# Patient Record
Sex: Female | Born: 1975 | Marital: Married
Health system: Southern US, Community
[De-identification: ages and names within clinical notes are randomized; demographics above are authoritative.]

---

## 2009-11-08 ENCOUNTER — Emergency Department: Payer: Self-pay | Admitting: Emergency Medicine

## 2010-01-02 ENCOUNTER — Emergency Department: Payer: Self-pay | Admitting: Emergency Medicine

## 2010-03-27 ENCOUNTER — Inpatient Hospital Stay: Payer: Self-pay | Admitting: Internal Medicine

## 2010-07-14 ENCOUNTER — Emergency Department: Payer: Self-pay | Admitting: Internal Medicine

## 2010-09-14 ENCOUNTER — Emergency Department: Payer: Self-pay | Admitting: Emergency Medicine

## 2010-09-15 ENCOUNTER — Emergency Department: Payer: Self-pay | Admitting: Unknown Physician Specialty

## 2010-10-18 ENCOUNTER — Emergency Department: Payer: Self-pay | Admitting: Emergency Medicine

## 2011-03-15 ENCOUNTER — Inpatient Hospital Stay: Payer: Self-pay | Admitting: *Deleted

## 2011-04-05 ENCOUNTER — Emergency Department: Payer: Self-pay | Admitting: Emergency Medicine

## 2011-06-21 ENCOUNTER — Emergency Department: Payer: Self-pay | Admitting: Unknown Physician Specialty

## 2011-07-05 ENCOUNTER — Other Ambulatory Visit: Payer: Self-pay | Admitting: Family Medicine

## 2011-11-30 ENCOUNTER — Inpatient Hospital Stay: Payer: Self-pay | Admitting: Internal Medicine

## 2011-11-30 LAB — COMPREHENSIVE METABOLIC PANEL
Albumin: 3.1 g/dL — ABNORMAL LOW (ref 3.4–5.0)
Alkaline Phosphatase: 109 U/L (ref 50–136)
BUN: 36 mg/dL — ABNORMAL HIGH (ref 7–18)
Calcium, Total: 9.7 mg/dL (ref 8.5–10.1)
Chloride: >128 mmol/L — ABNORMAL HIGH (ref 98–107)
Creatinine: 1.19 mg/dL (ref 0.60–1.30)
EGFR (African American): >60
Glucose: 226 mg/dL — ABNORMAL HIGH (ref 65–99)
Potassium: 3 mmol/L — ABNORMAL LOW (ref 3.5–5.1)
SGOT(AST): 55 U/L — ABNORMAL HIGH (ref 15–37)
SGPT (ALT): 60 U/L

## 2011-11-30 LAB — CBC
HGB: 15.4 g/dL (ref 13.0–18.0)
MCH: 31.4 pg (ref 26.0–34.0)
MCHC: 31.7 g/dL — ABNORMAL LOW (ref 32.0–36.0)
MCV: 99 fL (ref 80–100)
Platelet: 173 10*3/uL (ref 150–440)
RBC: 4.89 10*6/uL (ref 4.40–5.90)
RDW: 13.9 % (ref 11.5–14.5)

## 2011-11-30 LAB — URINALYSIS, COMPLETE
Ketone: NEGATIVE
Leukocyte Esterase: NEGATIVE
Nitrite: NEGATIVE
Ph: 5 (ref 4.5–8.0)
Protein: 100
RBC,UR: 11 /HPF (ref 0–5)
Specific Gravity: 1.034 (ref 1.003–1.030)
Squamous Epithelial: NONE SEEN

## 2011-12-01 LAB — BASIC METABOLIC PANEL
Calcium, Total: 7.9 mg/dL — ABNORMAL LOW (ref 8.5–10.1)
Calcium, Total: 8 mg/dL — ABNORMAL LOW (ref 8.5–10.1)
Co2: 28 mmol/L (ref 21–32)
Creatinine: 0.82 mg/dL (ref 0.60–1.30)
Creatinine: 1.01 mg/dL (ref 0.60–1.30)
EGFR (African American): >60
EGFR (African American): >60
EGFR (Non-African Amer.): >60
Glucose: 246 mg/dL — ABNORMAL HIGH (ref 65–99)
Potassium: 3.1 mmol/L — ABNORMAL LOW (ref 3.5–5.1)
Sodium: >160 mmol/L (ref 136–145)
Sodium: >160 mmol/L (ref 136–145)

## 2011-12-01 LAB — CBC WITH DIFFERENTIAL/PLATELET
Basophil #: 0 10*3/uL (ref 0.0–0.1)
Eosinophil #: 0.1 10*3/uL (ref 0.0–0.7)
HCT: 39.5 % — ABNORMAL LOW (ref 40.0–52.0)
Lymphocyte #: 2.5 10*3/uL (ref 1.0–3.6)
Lymphocyte %: 18 %
MCHC: 31.9 g/dL — ABNORMAL LOW (ref 32.0–36.0)
MCV: 99 fL (ref 80–100)
Monocyte %: 3.6 %
Neutrophil #: 10.6 10*3/uL — ABNORMAL HIGH (ref 1.4–6.5)
Neutrophil %: 77.3 %
Platelet: 141 10*3/uL — ABNORMAL LOW (ref 150–440)
RDW: 14.3 % (ref 11.5–14.5)
WBC: 13.8 10*3/uL — ABNORMAL HIGH (ref 3.8–10.6)

## 2011-12-01 LAB — SODIUM: Sodium: >160 mmol/L (ref 136–145)

## 2011-12-02 LAB — CBC WITH DIFFERENTIAL/PLATELET
Basophil %: 0.2 %
Eosinophil #: 0.3 10*3/uL (ref 0.0–0.7)
Eosinophil %: 2.7 %
HCT: 32.8 % — ABNORMAL LOW (ref 40.0–52.0)
HGB: 10.5 g/dL — ABNORMAL LOW (ref 13.0–18.0)
Lymphocyte #: 1.5 10*3/uL (ref 1.0–3.6)
Lymphocyte %: 15.7 %
Monocyte %: 4.5 %
Neutrophil #: 7.4 10*3/uL — ABNORMAL HIGH (ref 1.4–6.5)
Neutrophil %: 76.9 %
RBC: 3.35 10*6/uL — ABNORMAL LOW (ref 4.40–5.90)
RDW: 13.3 % (ref 11.5–14.5)
WBC: 9.6 10*3/uL (ref 3.8–10.6)

## 2011-12-02 LAB — URINE CULTURE

## 2011-12-02 LAB — BASIC METABOLIC PANEL
Anion Gap: 8 (ref 7–16)
BUN: 12 mg/dL (ref 7–18)
Chloride: 124 mmol/L — ABNORMAL HIGH (ref 98–107)
Creatinine: 1.03 mg/dL (ref 0.60–1.30)
EGFR (Non-African Amer.): >60
Glucose: 100 mg/dL — ABNORMAL HIGH (ref 65–99)
Osmolality: 311 (ref 275–301)
Potassium: 3.2 mmol/L — ABNORMAL LOW (ref 3.5–5.1)

## 2011-12-03 LAB — BASIC METABOLIC PANEL
Anion Gap: 5 — ABNORMAL LOW (ref 7–16)
BUN: 6 mg/dL — ABNORMAL LOW (ref 7–18)
Chloride: 122 mmol/L — ABNORMAL HIGH (ref 98–107)
Co2: 25 mmol/L (ref 21–32)
Creatinine: 0.98 mg/dL (ref 0.60–1.30)
EGFR (African American): >60
EGFR (Non-African Amer.): >60
Glucose: 115 mg/dL — ABNORMAL HIGH (ref 65–99)
Osmolality: 300 (ref 275–301)
Potassium: 3.7 mmol/L (ref 3.5–5.1)
Sodium: 152 mmol/L — ABNORMAL HIGH (ref 136–145)

## 2011-12-04 LAB — BASIC METABOLIC PANEL
Anion Gap: 11 (ref 7–16)
BUN: 5 mg/dL — ABNORMAL LOW (ref 7–18)
Chloride: 119 mmol/L — ABNORMAL HIGH (ref 98–107)
EGFR (Non-African Amer.): >60
Glucose: 126 mg/dL — ABNORMAL HIGH (ref 65–99)
Osmolality: 304 (ref 275–301)
Potassium: 3.8 mmol/L (ref 3.5–5.1)

## 2011-12-05 LAB — BASIC METABOLIC PANEL
Anion Gap: 9 (ref 7–16)
BUN: 4 mg/dL — ABNORMAL LOW (ref 7–18)
Co2: 25 mmol/L (ref 21–32)
Creatinine: 0.67 mg/dL (ref 0.60–1.30)
EGFR (African American): >60
Potassium: 4.3 mmol/L (ref 3.5–5.1)

## 2011-12-06 LAB — BASIC METABOLIC PANEL
Anion Gap: 11 (ref 7–16)
BUN: 7 mg/dL (ref 7–18)
Calcium, Total: 9.3 mg/dL (ref 8.5–10.1)
Chloride: 110 mmol/L — ABNORMAL HIGH (ref 98–107)
Co2: 28 mmol/L (ref 21–32)
Creatinine: 0.72 mg/dL (ref 0.60–1.30)
EGFR (African American): >60
EGFR (Non-African Amer.): >60
Glucose: 107 mg/dL — ABNORMAL HIGH (ref 65–99)
Osmolality: 295 (ref 275–301)
Potassium: 4.1 mmol/L (ref 3.5–5.1)
Sodium: 149 mmol/L — ABNORMAL HIGH (ref 136–145)

## 2011-12-06 LAB — CULTURE, BLOOD (SINGLE)

## 2011-12-06 LAB — PLATELET COUNT: Platelet: 282 10*3/uL (ref 150–440)

## 2011-12-07 LAB — BASIC METABOLIC PANEL
BUN: 9 mg/dL (ref 7–18)
Calcium, Total: 8.9 mg/dL (ref 8.5–10.1)
EGFR (African American): >60
EGFR (Non-African Amer.): >60
Glucose: 109 mg/dL — ABNORMAL HIGH (ref 65–99)
Osmolality: 290 (ref 275–301)
Potassium: 3.5 mmol/L (ref 3.5–5.1)
Sodium: 146 mmol/L — ABNORMAL HIGH (ref 136–145)

## 2011-12-08 LAB — SODIUM: Sodium: 147 mmol/L — ABNORMAL HIGH (ref 136–145)

## 2011-12-09 LAB — SODIUM: Sodium: 145 mmol/L (ref 136–145)

## 2011-12-28 ENCOUNTER — Inpatient Hospital Stay: Payer: Self-pay | Admitting: Internal Medicine

## 2011-12-28 LAB — URINALYSIS, COMPLETE
Bilirubin,UR: NEGATIVE
Blood: NEGATIVE
Glucose,UR: NEGATIVE mg/dL (ref 0–75)
Ketone: NEGATIVE
Leukocyte Esterase: NEGATIVE
Ph: 8 (ref 4.5–8.0)
RBC,UR: 1 /HPF (ref 0–5)
Squamous Epithelial: NONE SEEN
WBC UR: 2 /HPF (ref 0–5)

## 2011-12-28 LAB — CBC
HCT: 40.9 % (ref 40.0–52.0)
HGB: 13.4 g/dL (ref 13.0–18.0)
MCHC: 32.8 g/dL (ref 32.0–36.0)
Platelet: 397 10*3/uL (ref 150–440)
RBC: 4.24 10*6/uL — ABNORMAL LOW (ref 4.40–5.90)
WBC: 6.2 10*3/uL (ref 3.8–10.6)

## 2011-12-28 LAB — COMPREHENSIVE METABOLIC PANEL
Anion Gap: 9 (ref 7–16)
BUN: 13 mg/dL (ref 7–18)
Bilirubin,Total: 0.3 mg/dL (ref 0.2–1.0)
Calcium, Total: 9 mg/dL (ref 8.5–10.1)
Chloride: 104 mmol/L (ref 98–107)
Co2: 24 mmol/L (ref 21–32)
EGFR (African American): >60
SGOT(AST): 31 U/L (ref 15–37)
Total Protein: 8.6 g/dL — ABNORMAL HIGH (ref 6.4–8.2)

## 2011-12-28 LAB — TROPONIN I: Troponin-I: <0.02 ng/mL

## 2011-12-29 LAB — CBC WITH DIFFERENTIAL/PLATELET
Basophil #: 0 10*3/uL (ref 0.0–0.1)
Basophil %: 0.2 %
Eosinophil #: 0 10*3/uL (ref 0.0–0.7)
Eosinophil %: 0.5 %
HCT: 41.4 % (ref 40.0–52.0)
HGB: 13.5 g/dL (ref 13.0–18.0)
Lymphocyte #: 2.2 10*3/uL (ref 1.0–3.6)
Lymphocyte %: 22.8 %
MCH: 31.7 pg (ref 26.0–34.0)
MCHC: 32.5 g/dL (ref 32.0–36.0)
Monocyte #: 0.1 10*3/uL (ref 0.0–0.7)
Neutrophil #: 7.1 10*3/uL — ABNORMAL HIGH (ref 1.4–6.5)
RDW: 14.2 % (ref 11.5–14.5)
WBC: 9.4 10*3/uL (ref 3.8–10.6)

## 2011-12-29 LAB — PROTIME-INR: INR: 1

## 2011-12-29 LAB — BASIC METABOLIC PANEL
Anion Gap: 12 (ref 7–16)
BUN: 10 mg/dL (ref 7–18)
Calcium, Total: 9.2 mg/dL (ref 8.5–10.1)
Co2: 25 mmol/L (ref 21–32)
EGFR (Non-African Amer.): >60
Glucose: 79 mg/dL (ref 65–99)
Osmolality: 281 (ref 275–301)
Potassium: 4.6 mmol/L (ref 3.5–5.1)

## 2011-12-29 LAB — APTT: Activated PTT: 36.3 secs — ABNORMAL HIGH (ref 23.6–35.9)

## 2011-12-30 LAB — PROTIME-INR
INR: 1
Prothrombin Time: 13.7 secs (ref 11.5–14.7)

## 2011-12-30 LAB — APTT: Activated PTT: 74.3 secs — ABNORMAL HIGH (ref 23.6–35.9)

## 2011-12-31 LAB — APTT
Activated PTT: 140.8 secs — ABNORMAL HIGH (ref 23.6–35.9)
Activated PTT: 93.8 secs — ABNORMAL HIGH (ref 23.6–35.9)

## 2011-12-31 LAB — PLATELET COUNT: Platelet: 389 10*3/uL (ref 150–440)

## 2011-12-31 LAB — HEMOGLOBIN: HGB: 11.5 g/dL — ABNORMAL LOW (ref 13.0–18.0)

## 2012-01-01 LAB — APTT: Activated PTT: 141.2 secs — ABNORMAL HIGH (ref 23.6–35.9)

## 2012-01-01 LAB — PROTIME-INR
INR: 2.3
Prothrombin Time: 25.4 secs — ABNORMAL HIGH (ref 11.5–14.7)

## 2012-01-01 LAB — WOUND CULTURE

## 2012-01-02 LAB — CULTURE, BLOOD (SINGLE)

## 2012-01-28 ENCOUNTER — Other Ambulatory Visit: Payer: Self-pay

## 2012-01-28 LAB — PROTIME-INR: INR: 3.7

## 2012-07-16 ENCOUNTER — Emergency Department: Payer: Self-pay | Admitting: Emergency Medicine

## 2012-07-16 LAB — CBC WITH DIFFERENTIAL/PLATELET
Comment - H1-Com1: NORMAL
Eosinophil: 2 %
HCT: 44.2 % (ref 40.0–52.0)
MCV: 97 fL (ref 80–100)
Platelet: 187 10*3/uL (ref 150–440)
RDW: 13.3 % (ref 11.5–14.5)
Variant Lymphocyte - H1-Rlymph: 6 %
WBC: 9.5 10*3/uL (ref 3.8–10.6)

## 2012-07-16 LAB — COMPREHENSIVE METABOLIC PANEL
Albumin: 3.5 g/dL (ref 3.4–5.0)
Alkaline Phosphatase: 107 U/L (ref 50–136)
Anion Gap: 8 (ref 7–16)
BUN: 23 mg/dL — ABNORMAL HIGH (ref 7–18)
Bilirubin,Total: 0.7 mg/dL (ref 0.2–1.0)
Co2: 27 mmol/L (ref 21–32)
Creatinine: 0.78 mg/dL (ref 0.60–1.30)
EGFR (African American): >60
Osmolality: 309 (ref 275–301)
SGOT(AST): 31 U/L (ref 15–37)
Sodium: 154 mmol/L — ABNORMAL HIGH (ref 136–145)
Total Protein: 7.7 g/dL (ref 6.4–8.2)

## 2012-07-17 LAB — URINALYSIS, COMPLETE
Bilirubin,UR: NEGATIVE
Blood: NEGATIVE
Ketone: NEGATIVE
Ph: 7 (ref 4.5–8.0)
Protein: NEGATIVE
Specific Gravity: 1.026 (ref 1.003–1.030)
Squamous Epithelial: NONE SEEN

## 2012-07-18 LAB — URINE CULTURE

## 2012-12-12 ENCOUNTER — Emergency Department: Payer: Self-pay | Admitting: Internal Medicine

## 2012-12-12 LAB — COMPREHENSIVE METABOLIC PANEL
Alkaline Phosphatase: 108 U/L (ref 50–136)
BUN: 15 mg/dL (ref 7–18)
Bilirubin,Total: 0.5 mg/dL (ref 0.2–1.0)
Calcium, Total: 9 mg/dL (ref 8.5–10.1)
Co2: 28 mmol/L (ref 21–32)
EGFR (Non-African Amer.): >60
Glucose: 96 mg/dL (ref 65–99)
Osmolality: 273 (ref 275–301)
Potassium: 3.5 mmol/L (ref 3.5–5.1)
SGOT(AST): 25 U/L (ref 15–37)
Sodium: 136 mmol/L (ref 136–145)
Total Protein: 8.3 g/dL — ABNORMAL HIGH (ref 6.4–8.2)

## 2012-12-12 LAB — CBC
MCHC: 32.1 g/dL (ref 32.0–36.0)
MCV: 96 fL (ref 80–100)
Platelet: 353 10*3/uL (ref 150–440)
RDW: 13.6 % (ref 11.5–14.5)
WBC: 10.8 10*3/uL — ABNORMAL HIGH (ref 3.8–10.6)

## 2012-12-12 LAB — URINALYSIS, COMPLETE
Bacteria: NONE SEEN
Bilirubin,UR: NEGATIVE
Ketone: NEGATIVE
Ph: 8 (ref 4.5–8.0)

## 2012-12-12 LAB — PROTIME-INR
INR: 1.2
Prothrombin Time: 15.5 secs — ABNORMAL HIGH (ref 11.5–14.7)

## 2013-01-10 ENCOUNTER — Other Ambulatory Visit: Payer: Self-pay

## 2013-01-12 ENCOUNTER — Ambulatory Visit: Payer: Self-pay | Admitting: Internal Medicine

## 2013-01-18 LAB — WOUND CULTURE

## 2013-02-10 ENCOUNTER — Inpatient Hospital Stay: Payer: Self-pay | Admitting: Internal Medicine

## 2013-02-10 LAB — IRON AND TIBC: Iron: 19 ug/dL — ABNORMAL LOW (ref 65–175)

## 2013-02-10 LAB — COMPREHENSIVE METABOLIC PANEL
Alkaline Phosphatase: 281 U/L — ABNORMAL HIGH (ref 50–136)
Bilirubin,Total: 0.6 mg/dL (ref 0.2–1.0)
Calcium, Total: 8.9 mg/dL (ref 8.5–10.1)
Chloride: 102 mmol/L (ref 98–107)
Creatinine: 0.42 mg/dL — ABNORMAL LOW (ref 0.60–1.30)
EGFR (African American): >60
EGFR (Non-African Amer.): >60
Glucose: 139 mg/dL — ABNORMAL HIGH (ref 65–99)
SGOT(AST): 71 U/L — ABNORMAL HIGH (ref 15–37)
Sodium: 136 mmol/L (ref 136–145)
Total Protein: 9.4 g/dL — ABNORMAL HIGH (ref 6.4–8.2)

## 2013-02-10 LAB — URINALYSIS, COMPLETE
Bilirubin,UR: NEGATIVE
Ketone: NEGATIVE
WBC UR: 69 /HPF (ref 0–5)

## 2013-02-10 LAB — CBC
HCT: 27.7 % — ABNORMAL LOW (ref 40.0–52.0)
HGB: 8.4 g/dL — ABNORMAL LOW (ref 13.0–18.0)
MCH: 24.9 pg — ABNORMAL LOW (ref 26.0–34.0)
MCHC: 30.2 g/dL — ABNORMAL LOW (ref 32.0–36.0)
MCV: 82 fL (ref 80–100)
Platelet: 694 10*3/uL — ABNORMAL HIGH (ref 150–440)
RBC: 3.37 10*6/uL — ABNORMAL LOW (ref 4.40–5.90)
RDW: 20.8 % — ABNORMAL HIGH (ref 11.5–14.5)
WBC: 18.4 10*3/uL — ABNORMAL HIGH (ref 3.8–10.6)

## 2013-02-10 LAB — FOLATE: Folic Acid: 27.1 ng/mL (ref 3.1–100.0)

## 2013-02-10 LAB — FERRITIN: Ferritin (ARMC): 623 ng/mL — ABNORMAL HIGH (ref 8–388)

## 2013-02-11 ENCOUNTER — Ambulatory Visit: Payer: Self-pay | Admitting: Internal Medicine

## 2013-02-11 LAB — CBC WITH DIFFERENTIAL/PLATELET
Basophil #: 0 10*3/uL (ref 0.0–0.1)
Basophil %: 0.2 %
Eosinophil #: 0 10*3/uL (ref 0.0–0.7)
Eosinophil %: 0.1 %
HCT: 22 % — ABNORMAL LOW (ref 40.0–52.0)
Lymphocyte #: 1.3 10*3/uL (ref 1.0–3.6)
MCH: 25 pg — ABNORMAL LOW (ref 26.0–34.0)
MCHC: 30.6 g/dL — ABNORMAL LOW (ref 32.0–36.0)
Monocyte #: 1.2 x10 3/mm — ABNORMAL HIGH (ref 0.2–1.0)
Monocyte %: 8.8 %
Neutrophil %: 81.8 %
Platelet: 558 10*3/uL — ABNORMAL HIGH (ref 150–440)
RBC: 2.69 10*6/uL — ABNORMAL LOW (ref 4.40–5.90)
RDW: 20 % — ABNORMAL HIGH (ref 11.5–14.5)
WBC: 14.1 10*3/uL — ABNORMAL HIGH (ref 3.8–10.6)

## 2013-02-11 LAB — BASIC METABOLIC PANEL
Anion Gap: 4 — ABNORMAL LOW (ref 7–16)
BUN: 8 mg/dL (ref 7–18)
Chloride: 109 mmol/L — ABNORMAL HIGH (ref 98–107)
Creatinine: 0.44 mg/dL — ABNORMAL LOW (ref 0.60–1.30)
EGFR (African American): >60
Glucose: 126 mg/dL — ABNORMAL HIGH (ref 65–99)
Osmolality: 279 (ref 275–301)
Potassium: 3.3 mmol/L — ABNORMAL LOW (ref 3.5–5.1)

## 2013-02-11 LAB — HEMOGLOBIN: HGB: 6.8 g/dL — ABNORMAL LOW (ref 13.0–18.0)

## 2013-02-12 DIAGNOSIS — I339 Acute and subacute endocarditis, unspecified: Secondary | ICD-10-CM

## 2013-02-12 LAB — HEMOGLOBIN: HGB: 6.4 g/dL — ABNORMAL LOW (ref 13.0–18.0)

## 2013-02-12 LAB — CBC WITH DIFFERENTIAL/PLATELET
Eosinophil %: 0.1 %
HCT: 21.5 % — ABNORMAL LOW (ref 40.0–52.0)
MCH: 24.8 pg — ABNORMAL LOW (ref 26.0–34.0)
MCV: 82 fL (ref 80–100)
Monocyte %: 7.1 %
Neutrophil #: 12.2 10*3/uL — ABNORMAL HIGH (ref 1.4–6.5)
Neutrophil %: 82.5 %
Platelet: 537 10*3/uL — ABNORMAL HIGH (ref 150–440)
RBC: 2.63 10*6/uL — ABNORMAL LOW (ref 4.40–5.90)
RDW: 20 % — ABNORMAL HIGH (ref 11.5–14.5)

## 2013-02-13 LAB — VANCOMYCIN, TROUGH: Vancomycin, Trough: <1 ug/mL — ABNORMAL LOW (ref 10–20)

## 2013-02-13 LAB — BASIC METABOLIC PANEL
BUN: 6 mg/dL — ABNORMAL LOW (ref 7–18)
Chloride: 107 mmol/L (ref 98–107)
Co2: 29 mmol/L (ref 21–32)
Glucose: 109 mg/dL — ABNORMAL HIGH (ref 65–99)
Osmolality: 279 (ref 275–301)
Potassium: 2.9 mmol/L — ABNORMAL LOW (ref 3.5–5.1)
Sodium: 141 mmol/L (ref 136–145)

## 2013-02-13 LAB — CBC WITH DIFFERENTIAL/PLATELET
Basophil %: 0.7 %
Eosinophil #: 0 10*3/uL (ref 0.0–0.7)
HGB: 6.8 g/dL — ABNORMAL LOW (ref 13.0–18.0)
Lymphocyte %: 11.6 %
MCHC: 30.4 g/dL — ABNORMAL LOW (ref 32.0–36.0)
MCV: 80 fL (ref 80–100)
Monocyte %: 7.9 %
Neutrophil #: 13 10*3/uL — ABNORMAL HIGH (ref 1.4–6.5)
Neutrophil %: 79.7 %
RBC: 2.78 10*6/uL — ABNORMAL LOW (ref 4.40–5.90)

## 2013-02-13 LAB — POTASSIUM: Potassium: 3.6 mmol/L (ref 3.5–5.1)

## 2013-02-13 LAB — CULTURE, BLOOD (SINGLE)

## 2013-02-14 LAB — CBC WITH DIFFERENTIAL/PLATELET
Basophil %: 1.8 %
Eosinophil %: 0.4 %
MCH: 23.9 pg — ABNORMAL LOW (ref 26.0–34.0)
MCHC: 30.2 g/dL — ABNORMAL LOW (ref 32.0–36.0)
Monocyte #: 1 x10 3/mm (ref 0.2–1.0)
Neutrophil #: 14.5 10*3/uL — ABNORMAL HIGH (ref 1.4–6.5)
Platelet: 589 10*3/uL — ABNORMAL HIGH (ref 150–440)
RDW: 18.9 % — ABNORMAL HIGH (ref 11.5–14.5)
WBC: 18.9 10*3/uL — ABNORMAL HIGH (ref 3.8–10.6)

## 2013-02-14 LAB — BASIC METABOLIC PANEL
Anion Gap: 6 — ABNORMAL LOW (ref 7–16)
BUN: 8 mg/dL (ref 7–18)
Chloride: 107 mmol/L (ref 98–107)
Co2: 28 mmol/L (ref 21–32)
EGFR (African American): >60
EGFR (Non-African Amer.): >60
Glucose: 113 mg/dL — ABNORMAL HIGH (ref 65–99)
Osmolality: 280 (ref 275–301)
Potassium: 3.4 mmol/L — ABNORMAL LOW (ref 3.5–5.1)
Sodium: 141 mmol/L (ref 136–145)

## 2013-02-14 LAB — VANCOMYCIN, TROUGH: Vancomycin, Trough: 22 ug/mL (ref 10–20)

## 2013-02-14 LAB — OCCULT BLOOD X 1 CARD TO LAB, STOOL: Occult Blood, Feces: POSITIVE

## 2013-02-15 LAB — VANCOMYCIN, TROUGH: Vancomycin, Trough: 24 ug/mL (ref 10–20)

## 2013-02-15 LAB — CBC WITH DIFFERENTIAL/PLATELET
Basophil #: 0 10*3/uL (ref 0.0–0.1)
Basophil %: 0.1 %
Eosinophil #: 0.2 10*3/uL (ref 0.0–0.7)
HGB: 7.8 g/dL — ABNORMAL LOW (ref 13.0–18.0)
Lymphocyte %: 15.9 %
MCH: 24.5 pg — ABNORMAL LOW (ref 26.0–34.0)
MCV: 80 fL (ref 80–100)
Monocyte #: 1 x10 3/mm (ref 0.2–1.0)
Neutrophil %: 76.6 %
Platelet: 646 10*3/uL — ABNORMAL HIGH (ref 150–440)
RBC: 3.19 10*6/uL — ABNORMAL LOW (ref 4.40–5.90)
WBC: 15.2 10*3/uL — ABNORMAL HIGH (ref 3.8–10.6)

## 2013-02-15 LAB — SEDIMENTATION RATE: Erythrocyte Sed Rate: >140 mm/hr — ABNORMAL HIGH (ref 0–15)

## 2013-02-16 LAB — CBC WITH DIFFERENTIAL/PLATELET
Basophil #: 0.1 10*3/uL (ref 0.0–0.1)
Basophil %: 0.5 %
Eosinophil #: 0.1 10*3/uL (ref 0.0–0.7)
Eosinophil %: 0.9 %
HCT: 25.8 % — ABNORMAL LOW (ref 40.0–52.0)
HGB: 7.9 g/dL — ABNORMAL LOW (ref 13.0–18.0)
Lymphocyte #: 1.5 10*3/uL (ref 1.0–3.6)
Lymphocyte %: 9 %
MCH: 24.5 pg — ABNORMAL LOW (ref 26.0–34.0)
Monocyte #: 1 x10 3/mm (ref 0.2–1.0)
Neutrophil #: 13.8 10*3/uL — ABNORMAL HIGH (ref 1.4–6.5)
RDW: 19.6 % — ABNORMAL HIGH (ref 11.5–14.5)
WBC: 16.5 10*3/uL — ABNORMAL HIGH (ref 3.8–10.6)

## 2013-02-16 LAB — VANCOMYCIN, TROUGH: Vancomycin, Trough: 14 ug/mL (ref 10–20)

## 2013-02-17 LAB — CBC WITH DIFFERENTIAL/PLATELET
Basophil %: 0.7 %
Eosinophil #: 0.2 10*3/uL (ref 0.0–0.7)
Eosinophil %: 1.2 %
HCT: 26.2 % — ABNORMAL LOW (ref 40.0–52.0)
HGB: 8.1 g/dL — ABNORMAL LOW (ref 13.0–18.0)
MCH: 24.6 pg — ABNORMAL LOW (ref 26.0–34.0)
MCHC: 30.8 g/dL — ABNORMAL LOW (ref 32.0–36.0)
MCV: 80 fL (ref 80–100)
Monocyte %: 8.7 %
Neutrophil #: 12.4 10*3/uL — ABNORMAL HIGH (ref 1.4–6.5)
Neutrophil %: 73.3 %
Platelet: 701 10*3/uL — ABNORMAL HIGH (ref 150–440)
RDW: 20.1 % — ABNORMAL HIGH (ref 11.5–14.5)

## 2013-02-18 LAB — BASIC METABOLIC PANEL
BUN: 15 mg/dL (ref 7–18)
Calcium, Total: 8.5 mg/dL (ref 8.5–10.1)
Co2: 26 mmol/L (ref 21–32)
Creatinine: 0.64 mg/dL (ref 0.60–1.30)
EGFR (African American): >60
Glucose: 105 mg/dL — ABNORMAL HIGH (ref 65–99)
Osmolality: 281 (ref 275–301)
Potassium: 3.7 mmol/L (ref 3.5–5.1)
Sodium: 140 mmol/L (ref 136–145)

## 2013-02-18 LAB — CULTURE, BLOOD (SINGLE)

## 2013-02-18 LAB — CBC WITH DIFFERENTIAL/PLATELET
Eosinophil #: 0.2 10*3/uL (ref 0.0–0.7)
HGB: 7.7 g/dL — ABNORMAL LOW (ref 13.0–18.0)
Lymphocyte %: 22.8 %
MCH: 24.1 pg — ABNORMAL LOW (ref 26.0–34.0)
Neutrophil #: 12.2 10*3/uL — ABNORMAL HIGH (ref 1.4–6.5)
Neutrophil %: 67.6 %
Platelet: 682 10*3/uL — ABNORMAL HIGH (ref 150–440)
RDW: 19.9 % — ABNORMAL HIGH (ref 11.5–14.5)

## 2013-03-13 ENCOUNTER — Inpatient Hospital Stay: Payer: Self-pay | Admitting: Internal Medicine

## 2013-03-13 LAB — URINALYSIS, COMPLETE
Bilirubin,UR: NEGATIVE
Glucose,UR: NEGATIVE mg/dL (ref 0–75)
Ketone: NEGATIVE
Nitrite: NEGATIVE
Specific Gravity: 1.011 (ref 1.003–1.030)
Squamous Epithelial: NONE SEEN

## 2013-03-13 LAB — CBC
HCT: 27.9 % — ABNORMAL LOW (ref 40.0–52.0)
MCHC: 29.9 g/dL — ABNORMAL LOW (ref 32.0–36.0)
MCV: 80 fL (ref 80–100)
Platelet: 617 10*3/uL — ABNORMAL HIGH (ref 150–440)
RDW: 21.3 % — ABNORMAL HIGH (ref 11.5–14.5)
WBC: 16.6 10*3/uL — ABNORMAL HIGH (ref 3.8–10.6)

## 2013-03-13 LAB — COMPREHENSIVE METABOLIC PANEL
Anion Gap: 7 (ref 7–16)
Bilirubin,Total: 0.2 mg/dL (ref 0.2–1.0)
Calcium, Total: 8.7 mg/dL (ref 8.5–10.1)
Chloride: 98 mmol/L (ref 98–107)
Co2: 27 mmol/L (ref 21–32)
Creatinine: 1.42 mg/dL — ABNORMAL HIGH (ref 0.60–1.30)
EGFR (African American): >60
EGFR (Non-African Amer.): >60
Glucose: 96 mg/dL (ref 65–99)
Potassium: 4.2 mmol/L (ref 3.5–5.1)
SGOT(AST): 38 U/L — ABNORMAL HIGH (ref 15–37)
SGPT (ALT): 37 U/L (ref 12–78)
Sodium: 132 mmol/L — ABNORMAL LOW (ref 136–145)

## 2013-03-14 ENCOUNTER — Ambulatory Visit: Payer: Self-pay | Admitting: Urology

## 2013-03-14 ENCOUNTER — Ambulatory Visit: Payer: Self-pay | Admitting: Internal Medicine

## 2013-03-14 LAB — CREATININE, SERUM
Creatinine: 0.61 mg/dL (ref 0.60–1.30)
EGFR (Non-African Amer.): >60

## 2013-03-14 LAB — CBC WITH DIFFERENTIAL/PLATELET
Basophil %: 0.6 %
Eosinophil #: 0.1 10*3/uL (ref 0.0–0.7)
HCT: 26 % — ABNORMAL LOW (ref 40.0–52.0)
Lymphocyte #: 1.9 10*3/uL (ref 1.0–3.6)
MCH: 24.6 pg — ABNORMAL LOW (ref 26.0–34.0)
MCV: 80 fL (ref 80–100)
Monocyte #: 0.8 x10 3/mm (ref 0.2–1.0)
Monocyte %: 7.3 %
Neutrophil %: 73 %

## 2013-03-14 LAB — PROTIME-INR
INR: 1.4
Prothrombin Time: 16.8 secs — ABNORMAL HIGH (ref 11.5–14.7)

## 2013-03-18 LAB — CULTURE, BLOOD (SINGLE)

## 2013-04-02 ENCOUNTER — Inpatient Hospital Stay: Payer: Self-pay | Admitting: Specialist

## 2013-04-02 LAB — CBC WITH DIFFERENTIAL/PLATELET
Basophil #: 0.1 x10 3/mm 3
Basophil %: 0.5 %
Eosinophil #: 0.2 x10 3/mm 3
Eosinophil %: 1.1 %
HCT: 27.5 % — ABNORMAL LOW
HGB: 8.5 g/dL — ABNORMAL LOW
Lymphocyte %: 28.5 %
Lymphs Abs: 4.3 x10 3/mm 3 — ABNORMAL HIGH
MCH: 24.6 pg — ABNORMAL LOW
MCHC: 31 g/dL — ABNORMAL LOW
MCV: 79 fL — ABNORMAL LOW
Monocyte #: 1.1 "x10 3/mm " — ABNORMAL HIGH
Monocyte %: 7.3 %
Neutrophil #: 9.4 x10 3/mm 3 — ABNORMAL HIGH
Neutrophil %: 62.6 %
Platelet: 626 x10 3/mm 3 — ABNORMAL HIGH
RBC: 3.48 x10 6/mm 3 — ABNORMAL LOW
RDW: 20.6 % — ABNORMAL HIGH
WBC: 14.9 x10 3/mm 3 — ABNORMAL HIGH

## 2013-04-02 LAB — URINALYSIS, COMPLETE
Bilirubin,UR: NEGATIVE
Blood: NEGATIVE
Glucose,UR: NEGATIVE mg/dL
Ketone: NEGATIVE
Nitrite: NEGATIVE
Ph: 7
Protein: 100
RBC,UR: 52 /HPF
Specific Gravity: 1.024
Squamous Epithelial: <1
WBC UR: 126 /HPF

## 2013-04-02 LAB — BASIC METABOLIC PANEL WITH GFR
Anion Gap: 5 — ABNORMAL LOW
BUN: 15 mg/dL
Calcium, Total: 9.1 mg/dL
Chloride: 102 mmol/L
Co2: 28 mmol/L
Creatinine: 0.62 mg/dL
EGFR (African American): >60
EGFR (Non-African Amer.): >60
Glucose: 108 mg/dL — ABNORMAL HIGH
Osmolality: 271
Potassium: 3.9 mmol/L
Sodium: 135 mmol/L — ABNORMAL LOW

## 2013-04-02 LAB — SEDIMENTATION RATE: Erythrocyte Sed Rate: 120 mm/hr — ABNORMAL HIGH (ref 0–15)

## 2013-04-03 ENCOUNTER — Ambulatory Visit: Payer: Self-pay | Admitting: Orthopedic Surgery

## 2013-04-04 LAB — CBC WITH DIFFERENTIAL/PLATELET
Basophil #: 0.1 10*3/uL (ref 0.0–0.1)
Basophil %: 0.4 %
Eosinophil #: 0.1 10*3/uL (ref 0.0–0.7)
Eosinophil %: 0.5 %
HGB: 8.3 g/dL — ABNORMAL LOW (ref 13.0–18.0)
Lymphocyte #: 1.3 10*3/uL (ref 1.0–3.6)
MCH: 24.1 pg — ABNORMAL LOW (ref 26.0–34.0)
Monocyte #: 0.8 x10 3/mm (ref 0.2–1.0)
Neutrophil #: 14.4 10*3/uL — ABNORMAL HIGH (ref 1.4–6.5)
Neutrophil %: 86.3 %
RBC: 3.44 10*6/uL — ABNORMAL LOW (ref 4.40–5.90)

## 2013-04-04 LAB — BASIC METABOLIC PANEL
Anion Gap: 10 (ref 7–16)
BUN: 14 mg/dL (ref 7–18)
Calcium, Total: 8.8 mg/dL (ref 8.5–10.1)
Chloride: 103 mmol/L (ref 98–107)
Creatinine: 0.59 mg/dL — ABNORMAL LOW (ref 0.60–1.30)
Glucose: 136 mg/dL — ABNORMAL HIGH (ref 65–99)
Osmolality: 278 (ref 275–301)
Potassium: 3.2 mmol/L — ABNORMAL LOW (ref 3.5–5.1)

## 2013-04-05 LAB — CBC WITH DIFFERENTIAL/PLATELET
Basophil %: 0.7 %
Eosinophil #: 0.2 10*3/uL (ref 0.0–0.7)
Eosinophil %: 1.4 %
HCT: 25.7 % — ABNORMAL LOW (ref 40.0–52.0)
HGB: 7.9 g/dL — ABNORMAL LOW (ref 13.0–18.0)
Lymphocyte #: 1.5 10*3/uL (ref 1.0–3.6)
Lymphocyte %: 12.9 %
MCH: 24.2 pg — ABNORMAL LOW (ref 26.0–34.0)
MCHC: 30.9 g/dL — ABNORMAL LOW (ref 32.0–36.0)
MCV: 78 fL — ABNORMAL LOW (ref 80–100)
Monocyte #: 1 x10 3/mm (ref 0.2–1.0)
Monocyte %: 8.6 %
Neutrophil #: 8.7 10*3/uL — ABNORMAL HIGH (ref 1.4–6.5)
Neutrophil %: 76.4 %

## 2013-04-05 LAB — BASIC METABOLIC PANEL
EGFR (African American): >60
EGFR (Non-African Amer.): >60
Glucose: 173 mg/dL — ABNORMAL HIGH (ref 65–99)
Osmolality: 288 (ref 275–301)
Potassium: 2.8 mmol/L — ABNORMAL LOW (ref 3.5–5.1)

## 2013-04-06 LAB — BASIC METABOLIC PANEL
Calcium, Total: 8.7 mg/dL (ref 8.5–10.1)
Chloride: 114 mmol/L — ABNORMAL HIGH (ref 98–107)
Co2: 27 mmol/L (ref 21–32)
EGFR (Non-African Amer.): >60
Glucose: 132 mg/dL — ABNORMAL HIGH (ref 65–99)
Osmolality: 295 (ref 275–301)
Potassium: 3.7 mmol/L (ref 3.5–5.1)
Sodium: 147 mmol/L — ABNORMAL HIGH (ref 136–145)

## 2013-04-08 LAB — CULTURE, BLOOD (SINGLE)

## 2013-04-13 ENCOUNTER — Ambulatory Visit: Payer: Self-pay | Admitting: Internal Medicine

## 2013-08-20 ENCOUNTER — Emergency Department: Payer: Self-pay | Admitting: Emergency Medicine

## 2013-08-20 LAB — URINALYSIS, COMPLETE
Blood: NEGATIVE
Glucose,UR: NEGATIVE mg/dL (ref 0–75)
Ketone: NEGATIVE
Nitrite: POSITIVE
Ph: 7 (ref 4.5–8.0)
Protein: 30
RBC,UR: 18 /HPF (ref 0–5)
Squamous Epithelial: NONE SEEN
WBC UR: 289 /HPF (ref 0–5)

## 2013-08-20 LAB — COMPREHENSIVE METABOLIC PANEL
Albumin: 3.4 g/dL (ref 3.4–5.0)
Anion Gap: 6 — ABNORMAL LOW (ref 7–16)
BUN: 13 mg/dL (ref 7–18)
Bilirubin,Total: 0.5 mg/dL (ref 0.2–1.0)
Chloride: 106 mmol/L (ref 98–107)
EGFR (African American): >60
Osmolality: 276 (ref 275–301)
Potassium: 3.9 mmol/L (ref 3.5–5.1)
SGOT(AST): 33 U/L (ref 15–37)
SGPT (ALT): 35 U/L (ref 12–78)
Sodium: 138 mmol/L (ref 136–145)
Total Protein: 8.6 g/dL — ABNORMAL HIGH (ref 6.4–8.2)

## 2013-08-20 LAB — CBC WITH DIFFERENTIAL/PLATELET
Basophil #: 0 10*3/uL (ref 0.0–0.1)
Eosinophil #: 0 10*3/uL (ref 0.0–0.7)
HCT: 37.7 % — ABNORMAL LOW (ref 40.0–52.0)
HGB: 12.3 g/dL — ABNORMAL LOW (ref 13.0–18.0)
Lymphocyte #: 1.1 10*3/uL (ref 1.0–3.6)
Lymphocyte %: 14.2 %
MCH: 27.5 pg (ref 26.0–34.0)
MCHC: 32.7 g/dL (ref 32.0–36.0)
MCV: 84 fL (ref 80–100)
RBC: 4.49 10*6/uL (ref 4.40–5.90)

## 2013-08-25 LAB — CULTURE, BLOOD (SINGLE)

## 2013-11-15 ENCOUNTER — Inpatient Hospital Stay: Payer: Self-pay | Admitting: Internal Medicine

## 2013-11-15 LAB — CBC WITH DIFFERENTIAL/PLATELET
BASOS ABS: 0.1 10*3/uL (ref 0.0–0.1)
Basophil %: 0.7 %
Eosinophil #: 0.3 10*3/uL (ref 0.0–0.7)
Eosinophil %: 3.2 %
HCT: 42.6 % (ref 40.0–52.0)
HGB: 13.3 g/dL (ref 13.0–18.0)
Lymphocyte #: 3.3 10*3/uL (ref 1.0–3.6)
Lymphocyte %: 37.5 %
MCH: 29.7 pg (ref 26.0–34.0)
MCHC: 31.3 g/dL — AB (ref 32.0–36.0)
MCV: 95 fL (ref 80–100)
MONOS PCT: 10.1 %
Monocyte #: 0.9 x10 3/mm (ref 0.2–1.0)
NEUTROS ABS: 4.3 10*3/uL (ref 1.4–6.5)
NEUTROS PCT: 48.5 %
PLATELETS: 320 10*3/uL (ref 150–440)
RBC: 4.48 10*6/uL (ref 4.40–5.90)
RDW: 16 % — ABNORMAL HIGH (ref 11.5–14.5)
WBC: 8.9 10*3/uL (ref 3.8–10.6)

## 2013-11-15 LAB — COMPREHENSIVE METABOLIC PANEL
ALBUMIN: 3.4 g/dL (ref 3.4–5.0)
ALT: 15 U/L (ref 12–78)
Alkaline Phosphatase: 134 U/L — ABNORMAL HIGH
Anion Gap: 4 — ABNORMAL LOW (ref 7–16)
BILIRUBIN TOTAL: 0.3 mg/dL (ref 0.2–1.0)
BUN: 13 mg/dL (ref 7–18)
Calcium, Total: 9.6 mg/dL (ref 8.5–10.1)
Chloride: 105 mmol/L (ref 98–107)
Co2: 28 mmol/L (ref 21–32)
Creatinine: 0.55 mg/dL — ABNORMAL LOW (ref 0.60–1.30)
EGFR (African American): >60
EGFR (Non-African Amer.): >60
GLUCOSE: 92 mg/dL (ref 65–99)
OSMOLALITY: 274 (ref 275–301)
POTASSIUM: 4 mmol/L (ref 3.5–5.1)
SGOT(AST): 14 U/L — ABNORMAL LOW (ref 15–37)
SODIUM: 137 mmol/L (ref 136–145)
TOTAL PROTEIN: 8.7 g/dL — AB (ref 6.4–8.2)

## 2013-11-15 LAB — LIPASE, BLOOD: LIPASE: 119 U/L (ref 73–393)

## 2013-11-15 LAB — MAGNESIUM: Magnesium: 2.1 mg/dL

## 2013-11-16 LAB — URINALYSIS, COMPLETE
Bilirubin,UR: NEGATIVE
Blood: NEGATIVE
Glucose,UR: NEGATIVE mg/dL (ref 0–75)
Ketone: NEGATIVE
NITRITE: POSITIVE
PH: 7 (ref 4.5–8.0)
RBC,UR: 26 /HPF (ref 0–5)
Specific Gravity: 1.021 (ref 1.003–1.030)

## 2013-11-17 LAB — BASIC METABOLIC PANEL
Anion Gap: 6 — ABNORMAL LOW (ref 7–16)
BUN: 5 mg/dL — ABNORMAL LOW (ref 7–18)
CREATININE: 0.5 mg/dL — AB (ref 0.60–1.30)
Calcium, Total: 8.7 mg/dL (ref 8.5–10.1)
Chloride: 109 mmol/L — ABNORMAL HIGH (ref 98–107)
Co2: 25 mmol/L (ref 21–32)
EGFR (Non-African Amer.): >60
Glucose: 75 mg/dL (ref 65–99)
Osmolality: 275 (ref 275–301)
Potassium: 3.8 mmol/L (ref 3.5–5.1)
SODIUM: 140 mmol/L (ref 136–145)

## 2013-11-17 LAB — CBC WITH DIFFERENTIAL/PLATELET
Basophil #: 0 10*3/uL (ref 0.0–0.1)
Basophil %: 0.9 %
EOS ABS: 0.2 10*3/uL (ref 0.0–0.7)
Eosinophil %: 4.9 %
HCT: 38.6 % — AB (ref 40.0–52.0)
HGB: 12.3 g/dL — AB (ref 13.0–18.0)
LYMPHS ABS: 1.7 10*3/uL (ref 1.0–3.6)
LYMPHS PCT: 35.4 %
MCH: 29.9 pg (ref 26.0–34.0)
MCHC: 31.8 g/dL — AB (ref 32.0–36.0)
MCV: 94 fL (ref 80–100)
Monocyte #: 0.4 x10 3/mm (ref 0.2–1.0)
Monocyte %: 8.8 %
NEUTROS PCT: 50 %
Neutrophil #: 2.4 10*3/uL (ref 1.4–6.5)
Platelet: 271 10*3/uL (ref 150–440)
RBC: 4.1 10*6/uL — ABNORMAL LOW (ref 4.40–5.90)
RDW: 15.6 % — ABNORMAL HIGH (ref 11.5–14.5)
WBC: 4.8 10*3/uL (ref 3.8–10.6)

## 2013-11-17 LAB — MAGNESIUM: Magnesium: 1.9 mg/dL

## 2013-11-18 ENCOUNTER — Ambulatory Visit: Payer: Self-pay | Admitting: Internal Medicine

## 2013-11-18 LAB — BASIC METABOLIC PANEL
Anion Gap: 10 (ref 7–16)
BUN: 3 mg/dL — AB (ref 7–18)
Calcium, Total: 8.8 mg/dL (ref 8.5–10.1)
Chloride: 108 mmol/L — ABNORMAL HIGH (ref 98–107)
Co2: 23 mmol/L (ref 21–32)
Creatinine: 0.41 mg/dL — ABNORMAL LOW (ref 0.60–1.30)
EGFR (African American): >60
EGFR (Non-African Amer.): >60
Glucose: 68 mg/dL (ref 65–99)
Osmolality: 276 (ref 275–301)
POTASSIUM: 3.5 mmol/L (ref 3.5–5.1)
SODIUM: 141 mmol/L (ref 136–145)

## 2013-11-21 LAB — PHOSPHORUS: PHOSPHORUS: 1.9 mg/dL — AB (ref 2.5–4.9)

## 2013-11-21 LAB — MAGNESIUM: MAGNESIUM: 1.6 mg/dL — AB

## 2013-12-15 ENCOUNTER — Emergency Department: Payer: Self-pay | Admitting: Emergency Medicine

## 2014-05-22 ENCOUNTER — Inpatient Hospital Stay: Payer: Self-pay | Admitting: Internal Medicine

## 2014-05-22 LAB — COMPREHENSIVE METABOLIC PANEL
ALBUMIN: 3.6 g/dL (ref 3.4–5.0)
ANION GAP: 13 (ref 7–16)
Alkaline Phosphatase: 103 U/L
BUN: 18 mg/dL (ref 7–18)
Bilirubin,Total: 0.2 mg/dL (ref 0.2–1.0)
Calcium, Total: 8.4 mg/dL — ABNORMAL LOW (ref 8.5–10.1)
Chloride: 99 mmol/L (ref 98–107)
Co2: 24 mmol/L (ref 21–32)
Creatinine: 0.95 mg/dL (ref 0.60–1.30)
EGFR (Non-African Amer.): >60
Glucose: 157 mg/dL — ABNORMAL HIGH (ref 65–99)
Osmolality: 277 (ref 275–301)
Potassium: 4.3 mmol/L (ref 3.5–5.1)
SGOT(AST): 19 U/L (ref 15–37)
SGPT (ALT): 24 U/L
Sodium: 136 mmol/L (ref 136–145)
TOTAL PROTEIN: 8.3 g/dL — AB (ref 6.4–8.2)

## 2014-05-22 LAB — URINALYSIS, COMPLETE
Bilirubin,UR: NEGATIVE
Glucose,UR: 50 mg/dL (ref 0–75)
Hyaline Cast: 10
Ketone: NEGATIVE
Nitrite: POSITIVE
Ph: 6 (ref 4.5–8.0)
Protein: 100
RBC,UR: 1582 /HPF (ref 0–5)
Specific Gravity: 1.016 (ref 1.003–1.030)
Squamous Epithelial: 1
WBC UR: 233 /HPF (ref 0–5)

## 2014-05-22 LAB — CBC WITH DIFFERENTIAL/PLATELET
Basophil #: 0 10*3/uL (ref 0.0–0.1)
Basophil %: 0.3 %
EOS ABS: 0 10*3/uL (ref 0.0–0.7)
Eosinophil %: 0.3 %
HCT: 42.1 % (ref 40.0–52.0)
HGB: 13.4 g/dL (ref 13.0–18.0)
Lymphocyte #: 2 10*3/uL (ref 1.0–3.6)
Lymphocyte %: 16.8 %
MCH: 31.4 pg (ref 26.0–34.0)
MCHC: 31.8 g/dL — ABNORMAL LOW (ref 32.0–36.0)
MCV: 99 fL (ref 80–100)
Monocyte #: 0.5 x10 3/mm (ref 0.2–1.0)
Monocyte %: 4 %
NEUTROS ABS: 9.1 10*3/uL — AB (ref 1.4–6.5)
Neutrophil %: 78.6 %
PLATELETS: 372 10*3/uL (ref 150–440)
RBC: 4.26 10*6/uL — ABNORMAL LOW (ref 4.40–5.90)
RDW: 13.6 % (ref 11.5–14.5)
WBC: 11.6 10*3/uL — ABNORMAL HIGH (ref 3.8–10.6)

## 2014-05-22 LAB — PHOSPHORUS: Phosphorus: 2.8 mg/dL (ref 2.5–4.9)

## 2014-05-22 LAB — PROTIME-INR
INR: 1.4
Prothrombin Time: 16.7 secs — ABNORMAL HIGH (ref 11.5–14.7)

## 2014-05-22 LAB — MAGNESIUM: Magnesium: 1.9 mg/dL

## 2014-05-22 LAB — TROPONIN I: Troponin-I: <0.02 ng/mL

## 2014-05-23 LAB — CBC WITH DIFFERENTIAL/PLATELET
Basophil #: 0 10*3/uL (ref 0.0–0.1)
Basophil %: 0.4 %
Eosinophil #: 0.1 10*3/uL (ref 0.0–0.7)
Eosinophil %: 0.5 %
HCT: 37.7 % — ABNORMAL LOW (ref 40.0–52.0)
HGB: 12.3 g/dL — ABNORMAL LOW (ref 13.0–18.0)
LYMPHS ABS: 1.2 10*3/uL (ref 1.0–3.6)
Lymphocyte %: 8.7 %
MCH: 31.5 pg (ref 26.0–34.0)
MCHC: 32.7 g/dL (ref 32.0–36.0)
MCV: 96 fL (ref 80–100)
MONOS PCT: 4 %
Monocyte #: 0.5 x10 3/mm (ref 0.2–1.0)
Neutrophil #: 11.4 10*3/uL — ABNORMAL HIGH (ref 1.4–6.5)
Neutrophil %: 86.4 %
PLATELETS: 266 10*3/uL (ref 150–440)
RBC: 3.91 10*6/uL — ABNORMAL LOW (ref 4.40–5.90)
RDW: 13.8 % (ref 11.5–14.5)
WBC: 13.2 10*3/uL — AB (ref 3.8–10.6)

## 2014-05-23 LAB — BASIC METABOLIC PANEL
ANION GAP: 9 (ref 7–16)
BUN: 6 mg/dL — ABNORMAL LOW (ref 7–18)
CALCIUM: 8.5 mg/dL (ref 8.5–10.1)
CHLORIDE: 113 mmol/L — AB (ref 98–107)
Co2: 23 mmol/L (ref 21–32)
Creatinine: 0.62 mg/dL (ref 0.60–1.30)
GLUCOSE: 76 mg/dL (ref 65–99)
Osmolality: 285 (ref 275–301)
Potassium: 3.4 mmol/L — ABNORMAL LOW (ref 3.5–5.1)
SODIUM: 145 mmol/L (ref 136–145)

## 2014-05-23 LAB — MAGNESIUM: Magnesium: 2.1 mg/dL

## 2014-05-24 LAB — CREATININE, SERUM
Creatinine: 0.67 mg/dL (ref 0.60–1.30)
EGFR (Non-African Amer.): >60

## 2014-05-27 LAB — CULTURE, BLOOD (SINGLE)

## 2014-05-29 LAB — URINE CULTURE

## 2014-10-14 ENCOUNTER — Ambulatory Visit: Payer: Self-pay | Admitting: Internal Medicine

## 2014-10-27 ENCOUNTER — Inpatient Hospital Stay: Payer: Self-pay | Admitting: Internal Medicine

## 2014-10-27 LAB — COMPREHENSIVE METABOLIC PANEL
ALK PHOS: 98 U/L
ALT: 14 U/L
Albumin: 2.1 g/dL — ABNORMAL LOW (ref 3.4–5.0)
Anion Gap: 10 (ref 7–16)
BILIRUBIN TOTAL: 0.5 mg/dL (ref 0.2–1.0)
BUN: 19 mg/dL — ABNORMAL HIGH (ref 7–18)
CO2: 22 mmol/L (ref 21–32)
CREATININE: 1.48 mg/dL — AB (ref 0.60–1.30)
Calcium, Total: 7.1 mg/dL — ABNORMAL LOW (ref 8.5–10.1)
Chloride: 114 mmol/L — ABNORMAL HIGH (ref 98–107)
EGFR (African American): >60
EGFR (Non-African Amer.): 57 — ABNORMAL LOW
Glucose: 100 mg/dL — ABNORMAL HIGH (ref 65–99)
Osmolality: 293 (ref 275–301)
Potassium: 3.3 mmol/L — ABNORMAL LOW (ref 3.5–5.1)
SGOT(AST): 37 U/L (ref 15–37)
Sodium: 146 mmol/L — ABNORMAL HIGH (ref 136–145)
Total Protein: 5.7 g/dL — ABNORMAL LOW (ref 6.4–8.2)

## 2014-10-27 LAB — URINALYSIS, COMPLETE
Bilirubin,UR: NEGATIVE
Glucose,UR: 50 mg/dL (ref 0–75)
KETONE: NEGATIVE
Leukocyte Esterase: NEGATIVE
Nitrite: NEGATIVE
PH: 6 (ref 4.5–8.0)
RBC,UR: 1077 /HPF (ref 0–5)
SPECIFIC GRAVITY: 1.011 (ref 1.003–1.030)
Squamous Epithelial: NONE SEEN

## 2014-10-27 LAB — CBC WITH DIFFERENTIAL/PLATELET
Basophil #: 0 10*3/uL (ref 0.0–0.1)
Basophil %: 0.1 %
EOS PCT: 0.2 %
Eosinophil #: 0 10*3/uL (ref 0.0–0.7)
HCT: 33.3 % — ABNORMAL LOW (ref 40.0–52.0)
HGB: 10.1 g/dL — ABNORMAL LOW (ref 13.0–18.0)
LYMPHS ABS: 0.2 10*3/uL — AB (ref 1.0–3.6)
Lymphocyte %: 1.7 %
MCH: 29.9 pg (ref 26.0–34.0)
MCHC: 30.3 g/dL — AB (ref 32.0–36.0)
MCV: 99 fL (ref 80–100)
MONO ABS: 0 x10 3/mm — AB (ref 0.2–1.0)
Monocyte %: 0.2 %
Neutrophil #: 13.1 10*3/uL — ABNORMAL HIGH (ref 1.4–6.5)
Neutrophil %: 97.8 %
Platelet: 292 10*3/uL (ref 150–440)
RBC: 3.37 10*6/uL — ABNORMAL LOW (ref 4.40–5.90)
RDW: 14.8 % — ABNORMAL HIGH (ref 11.5–14.5)
WBC: 13.4 10*3/uL — ABNORMAL HIGH (ref 3.8–10.6)

## 2014-10-27 LAB — PHOSPHORUS
Phosphorus: 1.4 mg/dL — ABNORMAL LOW (ref 2.5–4.9)
Phosphorus: 2.2 mg/dL — ABNORMAL LOW (ref 2.5–4.9)

## 2014-10-27 LAB — MAGNESIUM
Magnesium: 1.6 mg/dL — ABNORMAL LOW
Magnesium: 2.3 mg/dL

## 2014-10-27 LAB — PROTIME-INR
INR: 2.1
Prothrombin Time: 22.8 secs — ABNORMAL HIGH (ref 11.5–14.7)

## 2014-10-28 LAB — BASIC METABOLIC PANEL
Anion Gap: 6 — ABNORMAL LOW (ref 7–16)
BUN: 8 mg/dL (ref 7–18)
CALCIUM: 7.5 mg/dL — AB (ref 8.5–10.1)
CHLORIDE: 112 mmol/L — AB (ref 98–107)
CO2: 22 mmol/L (ref 21–32)
Creatinine: 0.72 mg/dL (ref 0.60–1.30)
Glucose: 193 mg/dL — ABNORMAL HIGH (ref 65–99)
Osmolality: 283 (ref 275–301)
Potassium: 4.3 mmol/L (ref 3.5–5.1)
SODIUM: 140 mmol/L (ref 136–145)

## 2014-10-28 LAB — PHOSPHORUS
PHOSPHORUS: 1.1 mg/dL — AB (ref 2.5–4.9)
Phosphorus: 1 mg/dL — CL (ref 2.5–4.9)
Phosphorus: 1.7 mg/dL — ABNORMAL LOW (ref 2.5–4.9)

## 2014-10-28 LAB — MAGNESIUM: Magnesium: 2 mg/dL

## 2014-10-28 LAB — TSH: Thyroid Stimulating Horm: 2.23 u[IU]/mL

## 2014-10-28 LAB — HEMOGLOBIN A1C: Hemoglobin A1C: 4.6 % (ref 4.2–6.3)

## 2014-10-29 LAB — BASIC METABOLIC PANEL
Anion Gap: 10 (ref 7–16)
Anion Gap: 8 (ref 7–16)
BUN: 5 mg/dL — AB (ref 7–18)
BUN: 6 mg/dL — ABNORMAL LOW (ref 7–18)
CALCIUM: 8 mg/dL — AB (ref 8.5–10.1)
CHLORIDE: 112 mmol/L — AB (ref 98–107)
CO2: 23 mmol/L (ref 21–32)
CREATININE: 0.45 mg/dL — AB (ref 0.60–1.30)
CREATININE: 0.6 mg/dL (ref 0.60–1.30)
Calcium, Total: 7.3 mg/dL — ABNORMAL LOW (ref 8.5–10.1)
Chloride: 98 mmol/L (ref 98–107)
Co2: 20 mmol/L — ABNORMAL LOW (ref 21–32)
EGFR (African American): >60
EGFR (African American): >60
EGFR (Non-African Amer.): >60
Glucose: 127 mg/dL — ABNORMAL HIGH (ref 65–99)
Glucose: 100 mg/dL — ABNORMAL HIGH (ref 65–99)
OSMOLALITY: 256 (ref 275–301)
Osmolality: 283 (ref 275–301)
Potassium: 3.9 mmol/L (ref 3.5–5.1)
Potassium: 3.4 mmol/L — ABNORMAL LOW (ref 3.5–5.1)
SODIUM: 143 mmol/L (ref 136–145)
Sodium: 128 mmol/L — ABNORMAL LOW (ref 136–145)

## 2014-10-29 LAB — MAGNESIUM
MAGNESIUM: 1.4 mg/dL — AB
MAGNESIUM: 2.7 mg/dL — AB

## 2014-10-29 LAB — POTASSIUM: Potassium: 3.8 mmol/L (ref 3.5–5.1)

## 2014-10-29 LAB — OSMOLALITY: OSMOLALITY: 289 mosm/kg (ref 275–295)

## 2014-10-29 LAB — SODIUM, URINE, RANDOM: Sodium, Urine Random: 43 mmol/L (ref 20–110)

## 2014-10-29 LAB — OSMOLALITY, URINE: OSMOLALITY: 178 mosm/kg

## 2014-10-29 LAB — SODIUM: Sodium: 131 mmol/L — ABNORMAL LOW (ref 136–145)

## 2014-10-29 LAB — PHOSPHORUS
PHOSPHORUS: 2 mg/dL — AB (ref 2.5–4.9)
PHOSPHORUS: 2 mg/dL — AB (ref 2.5–4.9)
Phosphorus: 1.8 mg/dL — ABNORMAL LOW (ref 2.5–4.9)

## 2014-10-30 LAB — BASIC METABOLIC PANEL
ANION GAP: 8 (ref 7–16)
BUN: 7 mg/dL (ref 7–18)
CHLORIDE: 122 mmol/L — AB (ref 98–107)
CO2: 24 mmol/L (ref 21–32)
CREATININE: 0.41 mg/dL — AB (ref 0.60–1.30)
Calcium, Total: 7.3 mg/dL — ABNORMAL LOW (ref 8.5–10.1)
EGFR (African American): >60
Glucose: 115 mg/dL — ABNORMAL HIGH (ref 65–99)
Osmolality: 304 (ref 275–301)
Potassium: 3 mmol/L — ABNORMAL LOW (ref 3.5–5.1)
Sodium: 154 mmol/L — ABNORMAL HIGH (ref 136–145)

## 2014-10-30 LAB — PHOSPHORUS
PHOSPHORUS: 0.9 mg/dL — AB (ref 2.5–4.9)
PHOSPHORUS: 2.6 mg/dL (ref 2.5–4.9)

## 2014-10-30 LAB — MAGNESIUM: Magnesium: 2.1 mg/dL

## 2014-10-30 LAB — CULTURE, BLOOD (SINGLE)

## 2014-10-30 LAB — OSMOLALITY, URINE: Osmolality: 443 mOsm/kg

## 2014-10-31 LAB — BASIC METABOLIC PANEL
ANION GAP: 6 — AB (ref 7–16)
BUN: 9 mg/dL (ref 7–18)
CHLORIDE: 111 mmol/L — AB (ref 98–107)
CREATININE: 0.46 mg/dL — AB (ref 0.60–1.30)
Calcium, Total: 7.4 mg/dL — ABNORMAL LOW (ref 8.5–10.1)
Co2: 27 mmol/L (ref 21–32)
EGFR (Non-African Amer.): >60
Glucose: 93 mg/dL (ref 65–99)
OSMOLALITY: 285 (ref 275–301)
Potassium: 3.9 mmol/L (ref 3.5–5.1)
Sodium: 144 mmol/L (ref 136–145)

## 2014-10-31 LAB — PHOSPHORUS: Phosphorus: 1.9 mg/dL — ABNORMAL LOW (ref 2.5–4.9)

## 2014-10-31 LAB — MAGNESIUM: MAGNESIUM: 1.9 mg/dL

## 2014-11-05 LAB — CULTURE, BLOOD (SINGLE)

## 2014-11-14 ENCOUNTER — Ambulatory Visit: Payer: Self-pay | Admitting: Internal Medicine

## 2015-01-13 DEATH — deceased

## 2015-02-03 NOTE — Op Note (Signed)
PATIENT NAME:  Erica Manning, Erica Manning MR#:  [id number] DATE OF BIRTH:  [birth year]  DATE OF OPERATION:  02/17/2013  PREOPERATIVE DIAGNOSES: 1.  Methicillin-resistant Staphylococcus aureus bacteremia.  2.  Pseudomonas urinary tract infection.  3.  Decubitus ulcer.  4.  History of deep venous thrombosis and pulmonary embolism.  5.  Anemia.   POSTOPERATIVE DIAGNOSES: 1.  Methicillin-resistant Staphylococcus aureus bacteremia.  2.  Pseudomonas urinary tract infection.  3.  Decubitus ulcer.  4.  History of deep venous thrombosis and pulmonary embolism.  5.  Anemia.   PROCEDURES:  1. Ultrasound guidance for vascular access to left brachial vein.  2. Fluoroscopic guidance for placement of catheter.  3. Insertion of peripherally inserted central venous catheter, double-lumen, left arm.  SURGEON: Annice NeedyJason S Malaka Ruffner, M.D.   ANESTHESIA: Local.   BLOOD LOSS: Minimal.   INDICATION FOR PROCEDURE: This is a 39 year old individual with multiple above-mentioned issues. He has poor venous access. He has bacteremia. He will need extended IV antibiotics, and we are asked to place a PICC line.   DESCRIPTION OF PROCEDURE: The patient's left arm was sterilely prepped and draped, and a sterile surgical field was created. The left brachial vein was accessed under direct ultrasound guidance without difficulty with a micropuncture needle and permanent image was recorded. 0.018 wire was then placed into the superior vena cava. Peel-away sheath was placed over the wire. A double-lumen PICC line was then placed over the wire and the wire and peel-away sheath were removed. The catheter tip was placed into the superior vena cava and was secured at the skin at 42 cm with a sterile dressing. The catheter withdrew blood well and flushed easily with heparinized saline. The patient tolerated procedure well.   ____________________________ Annice NeedyJason S. Elianie Hubers, MD jsd:dm D: 02/17/2013 10:58:45 ET T: 02/17/2013 12:09:20  ET JOB#: [id number]  cc: Annice NeedyJason S. Amayrany Cafaro, MD, <Dictator> Annice NeedyJASON S Georgette Helmer MD ELECTRONICALLY SIGNED 03/01/2013 13:52
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
## 2015-02-03 NOTE — Consult Note (Signed)
Brief Consult Note: Diagnosis: B hip degeneration, possible osteomyelitis.   Patient was seen by consultant.   Consult note dictated.   Recommend further assessment or treatment.   Comments: Had long discussion over the phone with pt's wife, Alona BeneJoyce.  She would like to proceed with MRI of pelvis to evaluate for evidence of osteomyelitis in B proximal femurs.  No fixation of the proximal femurs is warranted, however, should the pt become hemodynamically unstable, and there is radiographic evidence of osteomyelitis, then open debridement may be necessary.  No current need for operative intervention.  Electronic Signatures: Choua Chalker, [place] Edward (MD)  (Signed 21-Jun-14 18:09)  Authored: Brief Consult Note   Last Updated: 21-Jun-14 18:09 by Merin Borjon, [place] Edward (MD)
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
## 2015-02-03 NOTE — Consult Note (Signed)
Brief Consult Note: Diagnosis: pressure ulcers bilateral lower extremities.   Patient was seen by consultant.   Comments: I examined both lower extremities as a second opinion and I see absolutely no need for debridement. Left ulcer small and shallow with excellent granulation tissue and right side not really ulcerated (healing well). Pt has excellent booties that prevent any pressure. Will sign off.  Electronic Signatures: Claude MangesMarterre, Ladashia Demarinis F (MD)  (Signed [phone]-May-14 08:46)  Authored: Brief Consult Note   Last Updated: 04-May-14 08:46 by Claude MangesMarterre, Lexie Morini F (MD)
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
## 2015-02-03 NOTE — Discharge Summary (Signed)
PATIENT NAME:  Erica Manning, [name] MR#:  [id number] DATE OF BIRTH:  [birth year]  DATE OF ADMISSION:  04/02/2013 DATE OF DISCHARGE:  04/06/2013  For a detailed note, please take a look at the history and physical done on admission by Dr. Rudene Rearwish.   DIAGNOSES AT DISCHARGE:   1.  Right hip fracture likely subacute in nature.  2.  Sepsis, now ruled out.  3.  History of previous osteomyelitis.  4.  History of traumatic brain injury resulting in quadriplegia.  5.  Hypertension.  6.  Seizure disorder.  7.  Multiple decubitus ulcers.  8.  Persistent sinus tachycardia.   DIET:  The patient is being discharged on the tube feedings that he normally takes.   ACTIVITY:  As tolerated.   FOLLOWUP:  With his primary care physician, Dr. Rolin BarryMario Olmedo, in the next 1 to 2 weeks.    DISCHARGE MEDICATIONS:  Tylenol suspension 10 mL q. 4 to 6 hours by G-tube, baclofen 10 mg b.i.d. via the G-tube, Keppra 100 mg/mL 10 mL b.i.d., Seroquel 25 mg once daily, ranitidine 15 mL b.i.d., Zoloft 20 mg per mL 25 mg daily, metoprolol tartrate 25 mg q. 6 hours, morphine 0.25 mL q. 2 hours as needed, Cardizem 30 mg q. 6 hours, Bactrim double strength 1 tab b.i.d.   CONSULTANTS DURING HOSPITAL COURSE:  Dr. Casimiro NeedleMichael Blocker from infectious disease and Dr. [place]had Smith from orthopedics.   PERTINENT STUDIES DONE DURING THE HOSPITAL COURSE:  An x-ray of the right hip showing a comminuted markedly displaced femoral head, neck and proximal shaft fracture, a component suggesting a subacute recurrence, severe osteopenia. X-ray of the pelvis showing bilateral hip fractures with subacute and chronic appearance as well. Chest x-ray showing discoid atelectasis. No evidence of acute cardiopulmonary disease.   HOSPITAL COURSE:  This is a 39 year old female with medical problems as mentioned above presented to the hospital with a suspected right hip fracture.  1.  Suspected right hip fracture. This was noticed on x-ray findings of the pelvis and  also of the right hip. The patient apparently is quadriplegic chronically and is contracted. It is unclear how it was noticed that he truly had pain or a fracture, although he was brought to the hospital without being in acute distress. An orthopedic consult was obtained. The patient was seen by Dr. [place] Smith who thought that this fracture was likely a subacute fracture and does not need any acute surgical intervention since the patient is quadriplegic and is not mobile. Unless the patient has an osteomyelitis or a collection that needs to be drained, no further surgical intervention is needed.  2.  Sepsis. The patient presented hypotensive with low-grade fevers. There was some concern that he may have sepsis related to a suspected osteomyelitis or urinary tract infection. The patient was placed on broad-spectrum IV antibiotics with vancomycin and Zosyn. An infectious disease consult was obtained. The patient was seen by Dr. Leavy CellaBlocker who did not think that the patient had any evidence of sepsis. Urinalysis was colonized with Pseudomonas and therefore not likely a true UTI. His blood cultures remain negative. He was taken off all broad-spectrum IV antibiotics and has remained afebrile and hemodynamically stable. As far as the osteomyelitis is concerned, the patient needs a surgical debridement and wound cultures from the area before treating osteomyelitis as IV antibiotics alone will not treat it. The patient's wife did not want to pursue any aggressive intervention with surgical debridement; therefore at this point, the patient is being discharged on  just prophylactic antibiotics with Bactrim for his osteomyelitis. The sepsis has now been ruled out.  3.  History of seizures. The patient had no evidence of acute seizure-type activity. He will continue his Keppra.  4.  Hypertension and tachycardia. The patient is chronically tachycardic. He will continue his diltiazem and metoprolol which is what he was maintained  on.  5.  Hypokalemia. This was supplemented and has since then resolved.   The patient is a DO NOT INTUBATE/DO NOT RESUSCITATE.   He is being discharged home with hospice services.   TIME SPENT:  40 minutes.    ____________________________ Rolly Pancake. Cherlynn Kaiser, MD vjs:si D: 04/06/2013 15:27:16 ET T: 04/06/2013 15:51:27 ET JOB#: [id number]  cc: Rolly Pancake. Cherlynn Kaiser, MD, <Dictator> Dione Housekeeper, MD Houston Siren MD ELECTRONICALLY SIGNED 04/16/2013 15:04

## 2015-02-03 NOTE — H&P (Signed)
PATIENT NAME:  Erica Manning, Erica Manning MR#:  [id number] DATE OF BIRTH:  10-09-1976  DATE OF ADMISSION:  02/10/2013   NO DICTATION:  ____________________________ Lacie ScottsShreyang H. Allena KatzPatel, MD shp:ct D: 02/10/2013 14:41:46 ET T: 02/10/2013 14:46:39 ET JOB#: [id number]  cc: Avika Carbine H. Allena KatzPatel, MD, <Dictator> Charise CarwinSHREYANG H Kathalene Sporer MD ELECTRONICALLY SIGNED 02/12/2013 20:12

## 2015-02-03 NOTE — Consult Note (Signed)
Brief Consult Note: Diagnosis: left iliac osteomyelitis.   Patient was seen by consultant.   Comments: Sorry, I thought I was to evaluate the feet this AM. I have reviewed the CT showing osteomyelitis and severe bony destruction of the lower left iliac bone. I have also examined eth sacral decubitus and left iliac area of skin and I see nothing that necessitates further surgery. The sacral decubitus wound is open, well-drained, and has a good, granulating base, with bone palpable and close, but not exposed. The iliac area has no areas that correspond to the CT findings. There are no areas of fluctuance, erythema, or with findings that are otherwise concerning.  Electronic Signatures: Claude MangesMarterre, Adeel Guiffre F (MD)  (Signed [phone]-May-14 17:15)  Authored: Brief Consult Note   Last Updated: 04-May-14 17:15 by Claude MangesMarterre, Mitsuko Luera F (MD)

## 2015-02-03 NOTE — Consult Note (Signed)
   Comments   Spoke with pt's wife via phone [phone]). Updated her on pt's status. She tells me that patient has been declining over past months and she recognizes that he may be approaching end of life. She has had discussions with medical staff in the past regarding scope of treatment and has been offered to stop and focus on comfort which she has been reluctant to do (ie. stop tube feedings) as she says that it is too difficult a decision. She recognizes that patient is high risk for recurrent infections and continued decline and says that there likely will be a point where she does not seek aggressive treatment. However, at this point she would want to pursue any treatment (ie. transfusion, abx, surgical consult, etc) that might allow patient to return home. She explains that if pt has 2 young children who are having a very difficult time as their mother is also ill and on hemodialysis and the children are moving into foster care (the daughter apparently self-mutilates) and pt's wife feels that if pt passed away now the children would suffer from a psychological hardship.  would like hospice involvement once patient returns home. Wife says that she would speak with hospice in the future about less aggressive measures. Will make hospice referral. Pt's children would also likely benefit from a [facility] referral.  updated.  20 minutes  Electronic Signatures for Addendum Section:  Phifer, Harriett SineNancy (MD) (Signed Addendum 02-May-14 15:20)  Discussed with Elouise MunroeJosh Monteen Toops, NP, in detail. Agree with assessment and plan as outlined in above note.   Electronic Signatures: Saretta Dahlem, Daryl EasternJoshua R (NP)  (Signed 02-May-14 14:00)  Authored: Palliative Care   Last Updated: 02-May-14 15:20 by Phifer, Harriett SineNancy (MD)
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
## 2015-02-03 NOTE — Consult Note (Signed)
   Comments   Have again attempted to contact pt's wife. Was unable to reach her. Will follow up tomorrow.  Electronic Signatures: Alesi Zachery, Daryl EasternJoshua R (NP)  (Signed 30-Apr-14 16:44)  Authored: Palliative Care Phifer, Harriett SineNancy (MD)  (Signed 30-Apr-14 16:56)  Authored: Palliative Care   Last Updated: 30-Apr-14 16:56 by Phifer, Harriett SineNancy (MD)
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
## 2015-02-04 NOTE — Consult Note (Signed)
Will attempt tomorrow to do PEG either in old site or new one. Xarelto should be out of his system by then.  Electronic Signatures: Scot JunElliott, Robert T (MD)  (Signed on 05-Feb-15 16:50)  Authored  Last Updated: 05-Feb-15 16:50 by Scot JunElliott, Robert T (MD)
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
## 2015-02-04 NOTE — Consult Note (Signed)
Due to patient on Xarelto I plan to wait till tomorrow to do the PEG insertion.  See dictated note.  Electronic Signatures: Scot JunElliott, Robert T (MD)  (Signed on 03-Feb-15 16:54)  Authored  Last Updated: 03-Feb-15 16:54 by Scot JunElliott, Robert T (MD)

## 2015-02-04 NOTE — Discharge Summary (Signed)
Dates of Admission and Diagnosis:  Date of Admission 22-May-2014   Date of Discharge 25-May-2014   Admitting Diagnosis seizures   Final Diagnosis Seizure Sepsis due to UTI ESBL UTi Ch urinary catheter Brain injury in past- bed bound- ch catheter.    Chief Complaint/History of Present Illness A 39 year old female with history of ABI seizure disorder, recurrent UTI, was sent from nursing home to ED due to seizure today. The patient is nonverbal, non-communicative. Unable to provide any information. According to the ED physician, the patient had 1 episode of seizure in the home today, had loss of consciousness. The patient also has vomiting.  The patient was off [place] for 1 day, waiting for a refill. After arriving in the ED the patient was noted to have a fever of 101.  The patient had cloudy urine in Foley bag, he had tachycardia in the 130s. Urinalysis showed UTI. The patient was treated with antibiotics in the ED and admitted for sepsis UTI.   Allergies:  Motrin: Unknown  Ibuprofen: Unknown  Hepatic:  09-Aug-15 11:31   Bilirubin, Total 0.2  Alkaline Phosphatase 103 (46-116 NOTE: New Reference Range 05/03/14)  SGPT (ALT) 24 (14-63 NOTE: New Reference Range 05/03/14)  SGOT (AST) 19  Total Protein, Serum  8.3  Albumin, Serum 3.6  LabUnknown:  09-Aug-15 12:52   ESBL POSITIVE  Routine Micro:  09-Aug-15 11:31   Micro Text Report BLOOD CULTURE   COMMENT                   NO GROWTH AEROBICALLY/ANAEROBICALLY IN 5 DAYS   ANTIBIOTIC                       Culture Comment NO GROWTH AEROBICALLY/ANAEROBICALLY IN 5 DAYS  Result(s) reported on 27 May 2014 at 12:00PM.    12:18   Micro Text Report BLOOD CULTURE   COMMENT                   NO GROWTH AEROBICALLY/ANAEROBICALLY IN 5 DAYS   ANTIBIOTIC                       Culture Comment NO GROWTH AEROBICALLY/ANAEROBICALLY IN 5 DAYS  Result(s) reported on 27 May 2014 at 12:00PM.    12:52   Organism Name [place] Name  ENTEROCOCCUS SPECIES  Organism Name ESCHERICHIA COLI (ESBL)  Organism Quantity >100,000 CFU/ML  Organism Quantity 50,000 CFU/ML  Organism Quantity >100,000 CFU/ML  Nitrofurantoin Sensitivity S  Nitrofurantoin Sensitivity S  Cefazolin Sensitivity S  Cefazolin Sensitivity R  Ampicillin Sensitivity R  Ampicillin Sensitivity S/1.5  Ampicillin Sensitivity R  Ceftriaxone Sensitivity S  Ceftriaxone Sensitivity R  Ciprofloxacin Sensitivity R  Ciprofloxacin Sensitivity R  Gentamicin Sensitivity S  Gentamicin Sensitivity S  Imipenem Sensitivity S  Imipenem Sensitivity S  Levofloxacin Sensitivity R  Levofloxacin Sensitivity R/>32  Levofloxacin Sensitivity R  Trimethoprim/Sulfamethoxazole Sensitivty R  Trimethoprim/Sulfamethoxazole Sensitivty R  Ertapenem Sensitivity S  Ertapenem Sensitivity S  Cefoxitin Sensitivity R  Cefoxitin Sensitivity S  Vancomycin Sensitivity S/1.5  Micro Text Report URINE CULTURE   ORGANISM 1                >100,000 CFU/ML ESCHERICHIA COLI (ESBL)   ORGANISM 2                50,000 CFU/ML ENTEROCOCCUS SPECIES   ORGANISM 3                >  100,000 CFU/ML ESCHERICHIA COLI   COMMENT                   ESBL (Extended Spectrum Beta Lactamase)   COMMENT                   -   COMMENT                   -   ANTIBIOTIC                    ORG#1    ORG#2    ORG#3     AMPICILLIN                    R        S/1.5    R         CEFAZOLIN                     R        S         CEFOXITIN                     S                 R         CEFTRIAXONE                   R                 S         CIPROFLOXACIN                 R                 R         ERTAPENEM                     S                 S   GENTAMICIN                    S                 S         IMIPENEM                      S                 S         LEVOFLOXACIN                  R        R/>32    R         NITROFURANTOIN                S                 S         ESBL                   POSITIVE                     TRIMETHOPRIM/SULFAMETHOXAZOLE R                 R  VANCOMYCIN                             S/1.5  Specimen Source INDWELLING CATHETER  Organism 1 >100,000 CFU/ML ESCHERICHIA COLI (ESBL)  Organism 2 50,000 CFU/ML ENTEROCOCCUS SPECIES  Organism 3 >100,000 CFU/ML ESCHERICHIA COLI  Culture Comment ESBL (Extended Spectrum Beta Lactamase)  Culture Comment . -  Culture Comment    . -  Routine Chem:  09-Aug-15 11:31   Creatinine (comp) 0.95  eGFR (African American) >60  eGFR (Non-African American) >60 (eGFR values <54mL/min/1.73 m2 may be an indication of chronic kidney disease (CKD). Calculated eGFR is useful in patients with stable renal function. The eGFR calculation will not be reliable in acutely ill patients when serum creatinine is changing rapidly. It is not useful in  patients on dialysis. The eGFR calculation may not be applicable to patients at the low and high extremes of body sizes, pregnant women, and vegetarians.)  Glucose, Serum  157  BUN 18  Sodium, Serum 136  Potassium, Serum 4.3  Chloride, Serum 99  CO2, Serum 24  Calcium (Total), Serum  8.4  Anion Gap 13  Osmolality (calc) 277  Magnesium, Serum 1.9 (1.8-2.4 THERAPEUTIC RANGE: 4-7 mg/dL TOXIC: > 10 mg/dL  -----------------------)  Phosphorus, Serum 2.8 (Result(s) reported on 22 May 2014 at 12:19PM.)    12:52   Result Comment E. COLI - ESBL - Extended Spectrum Beta Lactamase -  - The organism is resistant to penicillins,  - cephalosporins and aztreonam according to  - CLSI M100-S15 Vol.25 No.15 Oct 2003. ESBL - SKY TO EMILY HERRON @ 0817/081215  - READ-BACK PROCESS PERFORMED. ENTEROCOCCUS SPECIES - UNABLE TO FURTHER ID.  Result(s) reported on 29 May 2014 at 07:19AM.  10-Aug-15 04:58   Creatinine (comp) 0.62  eGFR (African American) >60  eGFR (Non-African American) >60 (eGFR values <69mL/min/1.73 m2 may be an indication of chronic kidney disease (CKD). Calculated eGFR is useful in patients with stable  renal function. The eGFR calculation will not be reliable in acutely ill patients when serum creatinine is changing rapidly. It is not useful in  patients on dialysis. The eGFR calculation may not be applicable to patients at the low and high extremes of body sizes, pregnant women, and vegetarians.)  Glucose, Serum 76  BUN  6  Sodium, Serum 145  Potassium, Serum  3.4  Chloride, Serum  113  CO2, Serum 23  Calcium (Total), Serum 8.5  Anion Gap 9  Osmolality (calc) 285  Magnesium, Serum 2.1 (1.8-2.4 THERAPEUTIC RANGE: 4-7 mg/dL TOXIC: > 10 mg/dL  -----------------------)  11-Aug-15 08:14   Creatinine (comp) 0.67  eGFR (African American) >60  eGFR (Non-African American) >60 (eGFR values <10mL/min/1.73 m2 may be an indication of chronic kidney disease (CKD). Calculated eGFR is useful in patients with stable renal function. The eGFR calculation will not be reliable in acutely ill patients when serum creatinine is changing rapidly. It is not useful in  patients on dialysis. The eGFR calculation may not be applicable to patients at the low and high extremes of body sizes, pregnant women, and vegetarians.)  Cardiac:  09-Aug-15 11:31   Troponin I < 0.02 (0.00-0.05 0.05 ng/mL or less: NEGATIVE  Repeat testing in 3-6 hrs  if clinically indicated. >0.05 ng/mL: POTENTIAL  MYOCARDIAL INJURY. Repeat  testing in 3-6 hrs if  clinically indicated. NOTE: An increase or decrease  of 30% or more on serial  testing suggests a  clinically important  change)  Routine UA:  09-Aug-15 12:52   Color (UA) Yellow  Clarity (UA) Cloudy  Glucose (UA) 50 mg/dL  Bilirubin (UA) Negative  Ketones (UA) Negative  Specific Gravity (UA) 1.016  Blood (UA) 3+  pH (UA) 6.0  Protein (UA) 100 mg/dL  Nitrite (UA) Positive  Leukocyte Esterase (UA) 3+ (Result(s) reported on 22 May 2014 at 01:14PM.)  RBC (UA) 1582 /HPF  WBC (UA) 233 /HPF  Bacteria (UA) 3+  Epithelial Cells (UA) 1 /HPF  WBC Clump (UA)  PRESENT  Mucous (UA) PRESENT  Hyaline Cast (UA) 10 /LPF (Result(s) reported on 22 May 2014 at 01:14PM.)  Routine Coag:  09-Aug-15 11:31   Prothrombin  16.7  INR 1.4 (INR reference interval applies to patients on anticoagulant therapy. A single INR therapeutic range for coumarins is not optimal for all indications; however, the suggested range for most indications is 2.0 - 3.0. Exceptions to the INR Reference Range may include: Prosthetic heart valves, acute myocardial infarction, prevention of myocardial infarction, and combinations of aspirin and anticoagulant. The need for a higher or lower target INR must be assessed individually. Reference: The Pharmacology and Management of the Vitamin K  antagonists: the seventh ACCP Conference on Antithrombotic and Thrombolytic Therapy. IRJJO.8416 Sept:126 (3suppl): [id number]. A HCT value >55% may artifactually increase the PT.  In one study,  the increase was an average of 25%. Reference:  "Effect on Routine and Special Coagulation Testing Values of Citrate Anticoagulant Adjustment in Patients with High HCT Values." American Journal of Clinical Pathology 2006;126:400-405.)  Routine Hem:  09-Aug-15 11:31   WBC (CBC)  11.6  RBC (CBC)  4.26  Hemoglobin (CBC) 13.4  Hematocrit (CBC) 42.1  Platelet Count (CBC) 372  MCV 99  MCH 31.4  MCHC  31.8  RDW 13.6  Neutrophil % 78.6  Lymphocyte % 16.8  Monocyte % 4.0  Eosinophil % 0.3  Basophil % 0.3  Neutrophil #  9.1  Lymphocyte # 2.0  Monocyte # 0.5  Eosinophil # 0.0  Basophil # 0.0 (Result(s) reported on 22 May 2014 at 12:03PM.)  10-Aug-15 04:58   WBC (CBC)  13.2  RBC (CBC)  3.91  Hemoglobin (CBC)  12.3  Hematocrit (CBC)  37.7  Platelet Count (CBC) 266  MCV 96  MCH 31.5  MCHC 32.7  RDW 13.8  Neutrophil % 86.4  Lymphocyte % 8.7  Monocyte % 4.0  Eosinophil % 0.5  Basophil % 0.4  Neutrophil #  11.4  Lymphocyte # 1.2  Monocyte # 0.5  Eosinophil # 0.1  Basophil # 0.0 (Result(s)  reported on 23 May 2014 at 05:41AM.)   PERTINENT RADIOLOGY STUDIES: XRay:    09-Aug-15 12:13, Chest Portable Single View  Chest Portable Single View   REASON FOR EXAM:    Sepsis  COMMENTS:       PROCEDURE: DXR - DXR PORTABLE CHEST SINGLE VIEW  - May 22 2014 12:13PM     CLINICAL DATA:  39 year old with seizure.    EXAM:  PORTABLE CHEST - 1 VIEW    COMPARISON:  11/15/2013    FINDINGS:  Single view ofthe chest demonstrates clear lungs. Heart and  mediastinum are within normal limits. Trachea is midline. Negative  for a pneumothorax. Posttraumatic and/or degenerative changes at the  right glenohumeral joint.     IMPRESSION:  No acute chest findings.      Electronically Signed    By: Markus Daft M.D.    On: 05/22/2014 12:39  Verified By: Burman Riis, M.D.,    12-Aug-15 17:16, Chest Portable Single View  Chest Portable Single View   REASON FOR EXAM:    checkcentral line placement  COMMENTS:       PROCEDURE: DXR - DXR PORTABLE CHEST SINGLE VIEW  - May 25 2014  5:16PM     CLINICAL DATA:  Status post left PICC line placement    EXAM:  PORTABLE CHEST - 1 VIEW    COMPARISON:  05/22/2014    FINDINGS:  Cardiac shadow is stable. The overall inspiratory effort is poor  with bibasilar atelectatic changes. A PICC line is noted with the  catheter tip at the cavoatrial junction. This is in satisfactory  position. No acute bony abnormality is seen.     IMPRESSION:  Satisfactory PICC line placement.    Bibasilar atelectasis due to poor inspiratory effort.      Electronically Signed    By: Inez Catalina M.D.    On: 05/25/2014 17:26         Verified By: Everlene Farrier, M.D.,   Pertinent Past History:  Pertinent Past History 1.  TBI, seizure disorder.  2.  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  3.  Chronic tachycardia.  4.  Anemia.  5.  Chronic Foley catheter insertion with recurrent UTIs. 6.  Hypertension.  7.  History of  meningitis. 8.  Urethrocutaneous fistula.   Hospital Course:  Hospital Course 1.  Seizure. - on keppra now, cont IV. Has hx of brain injury and seizures. stable. may be it was due to his Infection. 2.  Sepsis.- due to UTI- on ABX- in past had multi resistent organisms- including ESBL- so started ertapenem, stopped Zosyn. Urine cx reported ESBL- will get PICC line and d/c home with visiting nurse for total 14 days of IV ertapenem therapy. 3.  Urinary tract infection. - on ertapenem- stopped Vancomycin as bl cx negative- and have Gr neg rods in urine. 4.  Lactic acidosis. - due to sepsis- cont IV fluids and supportive care. improved sepsis. 5.  Anemia. stable. 6.  Hypertension. metoprolol 7.  Acquired brain injury with quadriplegia. ch issue, have hospice care at home with [facility].   Condition on Discharge Stable   DISCHARGE INSTRUCTIONS HOME MEDS:  Medication Reconciliation: Patient's Home Medications at Discharge:     Medication Instructions  fentanyl 50 mcg/hr transdermal film, extended release  1 patch transdermal every 3 days   morphine 20 mg/ml oral concentrate  0.5 milliliter(s) per tube every 2 hours, As Needed - for Pain   levetiracetam 100 mg/ml oral solution  10 milliliter(s) per tube 2 times a day   loperamide 2 mg oral capsule  2 capsules per tube after first loose stool, then 1 cap(s) per tube every 6 hours, As Needed - for Diarrhea    diltiazem 30 mg oral tablet  1 tab(s) per tube every 6 hours   metoprolol tartrate 25 mg oral tablet  1 tab(s) per tube every 6 hours   baclofen 10 mg oral tablet  1 tab(s) per tube once a day   quetiapine 25 mg oral tablet  1 tab(s) per tube once a day (at bedtime)   xarelto 20 mg oral tablet  1 tab(s) per tube once a day   osmolite 1.5 cal rth  60 milliliter(s) PEG / per hour via PEG residual and document every 4 hours. Hold tube feeding if residuals >250 mL; Recheck every 2 hours and restart when residuals <120mL.  ertapenem  1 gram(s) IV  every 24 hours x 12 days   pantoprazole  40 milligram(s) NGT once a day    STOP TAKING THE FOLLOWING MEDICATION(S):    nitrofurantoin macrocrystals 100 mg oral capsule: 1 cap(s) per tube every 6 hours  Physician's Instructions:  Home Health? Yes   [place] Instructions PICC line care as routine- remove PICC line after finishing 12 days of ABx therapy.   Diet Regular  Tube feed.   Activity Limitations As tolerated   Return to Work Not Applicable   Time frame for Follow Up Appointment 1-2 weeks  PMD   Other Comments follow with PMD in 2 weeks as routine.   Electronic Signatures: Vaughan Basta (MD)  (Signed 17-Aug-15 13:49)  Authored: ADMISSION DATE AND DIAGNOSIS, CHIEF COMPLAINT/HPI, Allergies, PERTINENT LABS, PERTINENT RADIOLOGY STUDIES, PERTINENT PAST HISTORY, HOSPITAL COURSE, DISCHARGE INSTRUCTIONS HOME MEDS, PATIENT INSTRUCTIONS   Last Updated: 17-Aug-15 13:49 by Vaughan Basta (MD)

## 2015-02-04 NOTE — H&P (Signed)
PATIENT NAME:  Erica Manning, Erica Manning MR#:  [id number] DATE OF BIRTH:  1976-03-25  DATE OF ADMISSION:  05/22/2014  PRIMARY CARE PHYSICIAN:  Nonlocal.   REFERRING PHYSICIAN:  Dr. Pershing Proud.  CHIEF COMPLAINT: Seizure today.   HISTORY OF PRESENT ILLNESS: A 39 year old female with history of ABI seizure disorder, recurrent UTI, was sent from nursing home to ED due to seizure today. The patient is nonverbal, non-communicative. Unable to provide any information. According to the ED physician, the patient had 1 episode of seizure in the home today, had loss of consciousness. The patient also has vomiting.  The patient was off Keppra for 1 day, waiting for a refill. After arriving in the ED the patient was noted to have a fever of 101.  The patient had cloudy urine in Foley bag, he had tachycardia in the 130s. Urinalysis showed UTI. The patient was treated with antibiotics in the ED and admitted for sepsis UTI.   PAST MEDICAL HISTORY:  1.  TBI, seizure disorder.  2.  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  3.  Chronic tachycardia.  4.  Anemia.  5.  Chronic Foley catheter insertion with recurrent UTIs. 6.  Hypertension.  7.  History of meningitis. 8.  Urethrocutaneous fistula.  SOCIAL HISTORY: Nursing home resident, no smoking or drinking or illicit drugs.   FAMILY HISTORY: [place].   PAST SURGICAL HISTORY: PEG tube placement.   REVIEW OF SYSTEMS:  The patient is nonverbal, unable to obtain.  ALLERGIES:  IBUPROFEN, MOTRIN.  HOME MEDICATIONS: Incomplete but include:   1.  Xarelto 20 mg per PEG tube once a day.  2.  Quetiapine 25 mg per PEG tube once daily.  3.  Osmolite 1.5 calorie RTH 60 mL per PEG tube per hour.               4.  Nitrofurantoin 100 mg 1 capsule per PEG tube every 6 hours.  5.  Morphine 20 mg/mL oral 0.5 mL per PEG tube every 2 hours p.r.n.  6.  Lopressor 25 mg p.o. every 6 hours per PEG tube.  7.  Loparin 2 mg 2 capsules per PEG tube after first loose  stool, then one capsule per PEG tube every 6 hours p.r.n. for diarrhea.  8.  Keppra 100 mg/mL 10 mL per PEG tube twice a day.  9.  Fentanyl 50 mcg per hour, 1 patch transdermal every 3 days.  10. Diltiazem 30 mg per PEG tube every 6 hours.  11. Baclofen 10 mg per PEG tube once a day.   PHYSICAL EXAMINATION:  VITAL SIGNS: Temperature 101.3, blood pressure 112/64, pulse was 134, now decreased to 94, oxygen saturation 96% on oxygen.  GENERAL: The patient is nonverbal, in no acute distress.  HEENT: Pupils are round, equal, about 4 mm, no reaction to light.  No discharge from ear or nose but has tiny amount of blood from the mouth. NECK: Supple. No JVD, no carotid bruit. No lymphadenopathy. No thyromegaly.  CARDIOVASCULAR: S1 and S2 with regular rate, tachycardia. No murmurs or gallops.  PULMONARY: Bilateral air entry. No wheezing, mild crackles. No use of accessory muscles to breathe.  ABDOMEN: Soft. PEG tube in situ. Bowel sounds present. No distention. It is difficult to estimate whether the patient has organomegaly.  EXTREMITIES: Contractures of the extremities. No edema, clubbing or cyanosis. Pedal pulses present.  NEUROLOGIC: The patient is nonverbal, unable to examine.   LABORATORY DATA: Urinalysis showed nitrite positive, RBC 1582, WBC 233, bacteria 3+.  PH of 7.35, pCO2 of 29, pO2 of 69, lactic acid 10, WBC 11.6, hemoglobin 13.4, platelets 372,000, glucose 157, BUN 18, creatinine 0.95, electrolytes normal, magnesium 1.9, troponin less than 0.02, phosphate 2.8, INR 1.4.   Chest x-ray showed no acute chest findings.   EKG shows sinus tachycardia at 123 BPM.   IMPRESSIONS:  1.  Seizure.  2.  Sepsis. 3.  Urinary tract infection.  4.  Lactic acidosis.  5.  Anemia.  6.  Hypertension.  7.  Acquired brain injury with quadriplegia.   PLAN OF TREATMENT:  1.  The patient will be admitted to the medical floor with telemetry monitoring. We will give Zosyn and vancomycin with contact  precautions. Follow up CBC, blood culture, urine culture. Isolation. We will continue Keppra 1000 mg b.i.d.  2.  Aspiration and seizure precautions.  3.  Continue home medications.   Patient's code status is DNR.  I discussed the patient's condition and plan of treatment with the ED physician.   TIME SPENT: About 55 minutes.    ____________________________ Shaune PollackQing Jeyda Siebel, MD qc:lt D: 05/22/2014 14:49:42 ET T: 05/22/2014 15:52:34 ET JOB#: [id number]  cc: Shaune PollackQing Shalom Ware, MD, <Dictator> Shaune PollackQING Jaycee Mckellips MD ELECTRONICALLY SIGNED 05/26/2014 10:54

## 2015-02-04 NOTE — Consult Note (Signed)
Pt had oozing from site where Dr. Bluford Kaufmannh attempted to use the original tract for a PEG tube.  Maybe that his Xarelto has not worn off yet so procedure aborted and will consider another attempt Friday.  Electronic Signatures: Scot JunElliott, Jamika Sadek T (MD)  (Signed on 04-Feb-15 16:30)  Authored  Last Updated: 04-Feb-15 16:30 by Scot JunElliott, Reneta Niehaus T (MD)

## 2015-02-04 NOTE — Consult Note (Signed)
PEG placed by Dr. Bluford Kaufmannh with my endoscopic assistance. Orders written, his wife was contacted about the placement.  Use for meds today and start feeding tomorrow.  Electronic Signatures: Scot JunElliott, Xia Stohr T (MD)  (Signed on 06-Feb-15 11:12)  Authored  Last Updated: 06-Feb-15 11:12 by Scot JunElliott, Calhoun Reichardt T (MD)

## 2015-02-04 NOTE — Consult Note (Signed)
I spoke to his wife and we will try again tomorrow to insert feeding tube.  Electronic Signatures: Scot JunElliott, Robert T (MD)  (Signed on 05-Feb-15 16:59)  Authored  Last Updated: 05-Feb-15 16:59 by Scot JunElliott, Robert T (MD)
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
## 2015-02-12 NOTE — Consult Note (Signed)
PATIENT NAME:  Erica Manning, Erica Manning MR#:  [id number] DATE OF BIRTH:  [birth year]  DATE OF CONSULTATION:  10/30/2014  CONSULTING PHYSICIAN:  Shakala Marlatt Lizabeth Leyden, MD  REQUESTING PHYSICIAN: Enid Baas, MD  REASON FOR CONSULTATION: Polyuria, hypernatremia.   HISTORY OF PRESENT ILLNESS: The patient is a 39 year old, African-American female with past medical history of traumatic brain injury, paraplegia, upper and lower extremity contractures, history of multiple decubitus ulcers, seizure disorder, history of osteomyelitis with gluteal abscess, atrial fibrillation who presented to [facility] with gross hematuria. He has a known urethral cutaneous fistula. He has chronic indwelling Foley catheter which was changed on the day of admission. The patient presented with bright red blood in his urine. Urology was initially consulted. The new Foley catheter was placed without incident. He was noted to be hypotensive upon admission and also had acute renal failure with a creatinine of 1.48. Creatinine is now down to 0.41; however, he is significantly hypernatremic with a serum sodium of 154 and a potassium of 3.0. He is on broad-spectrum antibiotics with the meropenem.   PAST MEDICAL HISTORY: 1. Traumatic brain injury with secondary seizure disorder.  2. History of MRSA infection secondary to osteomyelitis and gluteal abscess.  3. Atrial fibrillation.  4. Chronic tachycardia.  5. Anemia.  6. Chronic indwelling Foley catheter with recurrent urinary tract infections and urethral cutaneous fistula.  7. History of meningitis.  8. PEG tube placement.  9. History of large cranial defect.   ALLERGIES: IBUPROFEN AND MOTRIN.   CURRENT INPATIENT MEDICATIONS: Include:  1. Half-normal saline with 20 mEq of potassium chloride per liter at 75 mL/hour.  2. Baclofen 10 mg daily.  3. Fentanyl patch 50 mcg topical every 3 days.  4. Keppra 1000 mg every 12 hours.  5. Loperamide 2 mg every 6 hours  p.r.n. diarrhea.  6. Morphine 10 mg via PEG tube every 2 hours p.r.n.  7. Seroquel 25 mg at bedtime.  8. Xarelto 20 mg daily.  9. Sliding scale insulin.  10. Meropenem 1 gram IV every 8 hours.  11. Potassium phosphate/sodium phosphate 1 packet every 12 hours.   SOCIAL HISTORY: Unable to obtain directly from the patient; however, it appears that he lives at home with his wife and has nursing aides involved in his care. No reported tobacco, alcohol or illicit drug use.   FAMILY HISTORY: Unable to obtain from the patient at present.   REVIEW OF SYSTEMS: Unable to obtain from the patient given his underlying traumatic brain injury.   PHYSICAL EXAMINATION: VITAL SIGNS: Temperature 98.4, pulse 98, respirations 20, blood pressure 118/97, pulse oximetry 96%.   GENERAL: Reveals a slender-appearing, African-American female, currently in no acute distress.   HEENT: Large left cranial defect overlying the forehead. Pupils equal, round and reactive to light. No scleral icterus. Conjunctivae are pink. No epistaxis noted. Difficult to assess hearing. Oral mucosa moist.   NECK: Supple without JVD or lymphadenopathy.   LUNGS: Demonstrate rhonchi bilaterally with normal respiratory effort.   HEART: S1, S2 regular rate and rhythm. No murmurs, rubs or gallops appreciated.   ABDOMEN: Soft, nontender, nondistended. Bowel sounds present. No rebound or guarding. PEG tube present.   EXTREMITIES: No clubbing, cyanosis or edema noted.   NEUROLOGIC: The patient is awake, will nod yes and no to questions, however, responses are not consistent.   GENITOURINARY: Foley catheter noted to be in place.   MUSCULOSKELETAL: Contractures in both upper and lower extremities noted.   SKIN: Warm and dry. No rashes noted.  PSYCHIATRIC: Unable to assess at this time.   LABORATORY DATA: Sodium 154, potassium 3, chloride 122, CO2 of 24, BUN 7, creatinine 0.41, glucose 115. CBC shows WBCs 13.4, hemoglobin 10.1, hematocrit  33, platelets of 292,000. Urine sodium 43. Urine osmolality 178.   IMPRESSION: This is a 39 year old, African-American female with a past medical history of traumatic brain injury with secondary seizure disorder, history of methicillin-resistant Staphylococcus aureus infection secondary to osteomyelitis and gluteal abscess, atrial fibrillation, chronic tachycardia, anemia, chronic indwelling Foley catheter with recurrent urinary tract infections and urethral cutaneous fistula, history of meningitis, history of PEG tube placement who presented originally with hematuria.   PROBLEM LIST: 1. Polyuria.  2. Hypernatremia.  3. Recent acute renal failure, recovering  4. Hematuria.   PLAN: We were consulted for the evaluation and management of polyuria. The patient found to be hypernatremic as well. The patient could potentially have underlying diabetes insipidus versus inability to concentrate urine now given recovering acute tubular necrosis. A urine osmolality was found to be relatively low. We will repeat a urine osmolality today. I will discontinue current IV fluids and place him on D5W at 125 mL per hour. We will follow urine output today and if urine output remains quite high, we may need to consider giving desmopressin. Further plan as the patient progresses.   I would like to thank Dr. Nemiah CommanderKalisetti for this kind referral.     ____________________________ Lennox PippinsMunsoor N. Rayn Shorb, MD mnl:TT D: 10/30/2014 11:56:55 ET T: 10/30/2014 12:16:07 ET JOB#: [id number]  cc: Lennox PippinsMunsoor N. Tzirel Leonor, MD, <Dictator> Ria CommentMUNSOOR N Gearld Kerstein MD ELECTRONICALLY SIGNED 11/03/2014 9:40

## 2015-02-12 NOTE — H&P (Signed)
PATIENT NAME:  Erica Manning, Gurbani MR#:  [id number] DATE OF BIRTH:  20-Aug-1976  DATE OF ADMISSION:  10/27/2014  REFERRING PHYSICIAN:  Enedina Finnerandolph N. Manson PasseyBrown, MD   PRIMARY CARE PHYSICIAN:  Nonlocal.   ADMISSION DIAGNOSIS:  Sepsis.   HISTORY OF PRESENT ILLNESS:  This is a 39 year old African-American female with past medical history significant for traumatic brain injury, paraplegia, multiple contractures of his extremities, and multiple decubitus wounds, who presents with gross hematuria in his urine bag. The patient has a chronic indwelling Foley catheter, which was changed today. Upon arrival in the Emergency Department, the fluid in the urine container was bright red and viscous-like sludge. When it was drained, it was obviously grossly bloody. Urology was consulted, who stated, as we had suspected, that the Foley catheter may have been retracted with the balloon still inflated. A new Foley catheter was placed without incident, being careful to navigate past the urethrocutaneous fistula. The bladder was irrigated with 3 liters of normal saline and gross hematuria subsequently cleared. Also of note, the patient's blood pressure was fairly low with systolics in the 60s. He was fluid resuscitated intravenously with 3 liter boluses and his pressure responded, albeit temporarily. Due to his symptoms of sepsis and chronic and obvious potential sources for infection, the Emergency Department called for admission for potential septicemia.   REVIEW OF SYSTEMS:  The patient is nonverbal, and thus he cannot contribute to review of systems. When asked how he is feeling or what body parts may hurt, he shakes his head as if to answer yes. I have come to discover the patient shakes his head in the affirmative to any question asked.   PAST MEDICAL HISTORY:  Traumatic brain injury with secondary seizure disorder, history of MRSA infection secondary to osteomyelitis and gluteal abscesses, atrial fibrillation, possible  hypertension, chronic tachycardia, anemia, indwelling Foley catheter with recurrent UTIs, history of meningitis, and urethrocutaneous fistula.   PAST SURGICAL HISTORY:  PEG tube placement.   SOCIAL HISTORY:  I am told the patient lives at home with his wife, although previous documentation states that he was in a nursing home. It is clear that he has nursing aides in the home. He does not smoke, drink, or do any illicit drugs.   FAMILY HISTORY:  Unknown at this time, as the patient's wife has returned home and is not available for interview.   HOME MEDICATIONS: 1.  Baclofen 10 mg 1 tablet per tube daily.  2.  Diltiazem 30 mg 1 tablet per tube every 6 hours.  3.  Fentanyl 50 mcg/h transdermal film extended release one patch applied to the skin every 3 days.  4.  Levetiracetam or Keppra 100 mg/mL 10 mL per tube b.i.d.  5.  Loperamide 2 mg 2 capsules per tube for the first loose stool and then 1 capsule per tube every 6 hours thereafter as needed for diarrhea.  6.  Metoprolol tartrate 25 mg 1 tablet per tube every 6 hours.  7.  Morphine 20 mg/mL oral concentrated formula 0.5 mL per tube every 2 hours as needed for pain.  8.  Osmolite 1.5 calorie 60 mL per hour via PEG for 4 hours, hold if residuals are more than 250 mL.  9.  Pantoprazole 40 mg per tube daily.  10.  Quetiapine 25 mg 1 tablet per tube at bedtime.  11.  Xarelto 20 mg 1 tablet per tube daily.   ALLERGIES:  IBUPROFEN AND MOTRIN.   PERTINENT LABORATORY RESULTS AND RADIOGRAPHIC FINDINGS:  Serum glucose  is 100, BUN 19, creatinine 1.48, serum sodium 146, potassium 3.3, chloride 114, bicarbonate 22, calcium 7.1, serum albumin 2.1, alkaline phosphatase 98, AST 37, and ALT is 14. White blood cell count is 13.4, hemoglobin 10.1, hematocrit 33.3, platelet count 292,000, and MCV is 99. Chest x-ray shows atelectasis in the lower lobes. There is elevation of the right hemidiaphragm allowing for lower lung volumes. There is no significant change  from the prior study. X-ray, anterior-posterior view of the pelvis, only showed a Foley catheter at the level of the posterior aspect of the penile urethra. There is an underlying soft tissue defect to the level of the catheter that is not well characterized on the plain film. There is marked degenerative change of both hips with resorption of both femoral heads, extensive heterotopic bone formation, displacement of the proximal right femur, and left-sided protrusio acetabuli.    PHYSICAL EXAMINATION: VITAL SIGNS:  Temperature is 98.6 orally, pulse 122, respirations 20, blood pressure 73/62, and pulse oximetry is 95% on 3 liters of oxygen via nasal cannula.  GENERAL:  The patient is arousable. It is unclear if he is oriented, as he shakes his head yes to every question asked. He does not appear in any distress at this time.  HEENT:  The patient has a left hemicerebrum that is concave and without replacement plating following a craniotomy due to his traumatic brain injury. Extraocular movements are intact, although notably the patient does not follow commands with physical exam, but he will track the examiner around the room. Mucous membranes are dry.  NECK:  Trachea is midline. No adenopathy. Thyroid is nonpalpable.  CHEST:  Symmetric and atraumatic.  CARDIOVASCULAR:  Tachycardic rate and irregularly irregular rhythm with normal S1 and normal S2. No rubs, clicks, or murmurs appreciated.  LUNGS:  Generally clear bilaterally. There is some transmitted upper airway noise that generally clears when the patient tries to clear his throat.  ABDOMEN:  Positive bowel sounds. Soft but diffusely tender. The patient has some voluntary guarding, but no rebound tenderness.  GENITOURINARY:  The patient has normally developed external female genitalia. However, there is a defect in the skin at the base of the penile shaft on the ventral side of the penis directly where the shaft meets the scrotum. The Foley catheter is  visualized through this defect in the skin. There is no drainage, pus, or blood from the defect.  MUSCULOSKELETAL:  The patient's hips are flared in the butterfly position bilaterally. When I try to roll the patient to see his posterior legs and back, the patient has no abduction of the pelvis and appears frozen in the position already described.  SKIN:  The patient does not have any rashes visible but does have a decubitus ulcer superior to the gluteal cleft as well as a decubitus ulcer on the right upper buttock. Both are dressed. The patient has some dried blood and new soft stool in his diaper. We will change the patient promptly.  NEUROLOGIC:  The patient does not participate in cranial nerve testing; however, it is clear that cranial nerves III, IV, and VI work. The patient can also grimace, which indicates that there are some parts of cranial nerves VII and V that also work.  PSYCHIATRIC:  The patient seems pleasant, but it is difficult to assess his affect. I cannot assess his insight or judgment into his illness, again, as the patient only nods his head yes to any question asked.   ASSESSMENT AND PLAN:  This is  a 39 year old female admitted for sepsis and gross hematuria.   1.  Sepsis. The patient meets criteria via tachycardia, leukocytosis, and hypotension. Granted he has a potential source for his leukocytosis, as he has chronic decubitus wounds, his white blood cell count is only marginally elevated over the last time he was admitted to the hospital for sepsis rule out. He also carries a diagnosis of chronic tachycardia and likely has some autonomic instability due to his traumatic brain injury. Thus, I have a high suspicion that the patient does not have septicemia. Nonetheless, he is dehydrated but fluid responsive. He has received 3 liters already in the Emergency Department, and we will continue 1-1/2 times maintenance rate as well as replete his potassium with his maintenance fluid. We have  obtained blood cultures and urine cultures, and we will start the patient on vancomycin and Zosyn.  2.  Gross hematuria. There was approximately 500 mL of gross urine in the urine bag. This is presumably due to traumatic retraction of the Foley catheter with the bulb still inflated. The Foley has now been replaced and irrigated. We will continue to observe for clearing of the urine.  3.  Acute kidney injury. The patient's creatinine is significantly elevated over baseline, but he still has a GFR more than 60. This will likely improve with hydration. We will attempt to avoid nephrotoxic drugs.  4.  Gluteal decubiti. I have placed a wound consult to suggest alternate methods to help close those wounds. He may need some durable medical equipment that helps take pressure off of those wounds, as he is unable to change positions in bed.  5.  Atrial fibrillation. The patient's rate is uncontrolled, but apparently his heart rate fluctuates considerably at baseline. We will continue metoprolol and diltiazem for right now.  6.  Chronic tachycardia. We will continue medications per home regimen as long as his blood pressure continues to be fluid responsive.  7.  Seizure disorder. We will continue Keppra.  8.  Urethrocutaneous fistula is stable at this time. If we have to manipulate the catheter, we will obtain input from urology.  9.  Contractures. We will continue baclofen per the patient's home regimen.  10.  Deep vein thrombosis prophylaxis. Continue Xarelto.  11.  Gastrointestinal prophylaxis. Pantoprazole.   CODE STATUS:  The patient is a DO NOT RESUSCITATE. He is not to be resuscitated.   Time spent on admission orders and patient care:  Approximately 45 minutes.   ____________________________ Kelton Pillar. Sheryle Hail, MD msd:nb D: 10/27/2014 05:57:09 ET T: 10/27/2014 06:37:06 ET JOB#: [id number]  cc: Kelton Pillar. Sheryle Hail, MD, <Dictator> Kelton Pillar Frantz Quattrone MD ELECTRONICALLY SIGNED 11/08/2014 2:34

## 2015-02-12 NOTE — Discharge Summary (Signed)
PATIENT NAME:  Erica Manning, Erica Manning MR#:  [id number] DATE OF BIRTH:  01/02/1976  DATE OF ADMISSION:  10/27/2014 DATE OF DISCHARGE:  11/01/2014  ADMITTING PHYSICIAN: Kelton PillarMichael S. Sheryle Hailiamond, MD   DISCHARGING PHYSICIAN: Enid Baasadhika Tondra Reierson, MD   PRIMARY CARE PHYSICIAN: Nonlocal.   CONSULTATIONS IN THE HOSPITAL:  1. Pulmonary critical care consultation with Dr. Belia HemanKasa.  2. Palliative care consultation by Dr. Harvie JuniorPhifer.  3. Nephrology consultation with Dr. Mady HaagensenMunsoor Lateef.   FINAL DIAGNOSES: 1. Sepsis.  2. Extended-spectrum beta-lactamases Escherichia coli bacteremia.  3. Enterococcus faecalis.  4. Traumatic brain injury.  5. Quadriplegia of advanced status, only able to move left upper extremity.  6. Neurogenic bladder and chronic Foley catheter and history of frequent urinary tract infections.  7. Seizure disorder.  8. History of osteomyelitis and methicillin-resistant Staphylococcus aureus infection and gluteal abscess.  9. Chronic atrial fibrillation.  10. Chronic tachycardia.  11. Anemia of chronic disease.  12. Urethrocutaneous fistula.  13. History of meningitis.  14. Dysphagia and percutaneous endoscopic gastrostomy tube placement.   DISCHARGE MEDICATIONS: 1. Baclofen 10 mg p.o. daily.  2. Fentanyl 50 mcg topically q. 3 days transcutaneous patch. Roxanol 20 mg/mL concentrate 0.5 mL every 1 hour as needed for pain or dyspnea.  3. Keppra 1000 mg p.o. b.i.d.  4.   Quetiapine 25 mg p.o. at bedtime.   LABORATORY DATA AND IMAGING STUDIES: Blood cultures from 10/27/2014 growing ESBL Escherichia coli and also Enterococcus faecalis.   Sodium 144, potassium 3.9, chloride 111, bicarbonate 27, BUN 9, creatinine 0.46, glucose 93, calcium 7.4, magnesium 1.9.   WBC 13.4, hemoglobin 10.2, hematocrit 33.3, platelet count 292.   BRIEF HOSPITAL COURSE: Erica Manning is a 39 year old unfortunate African American female with past medical history significant for traumatic brain injury resulting in  quadriplegia, bedbound status, multiple decubitus ulcers, urethrocutaneous fistula, and neurogenic bladder, history of previous urinary tract infections. He was brought from home secondary to hematuria and thick urine in the Foley.  1. Sepsis likely source is urinary tract infection. His blood cultures were growing extended-spectrum beta-lactamases Escherichia coli. He was on meropenem empirically, requiring 2 pressors in the ICU. Slowly weaned off the pressors, moved over to the floor still on IV antibiotics. His overall clinical condition has been declining gradually. Palliative care team talked to the patient's family, and the patient's wife wanted the patient to be comfort care and go to hospice home. So, the antibiotics  were stopped, and he is being discharged to hospice home at this time.   ADDITIONAL TIME SPENT: 35 minutes.   ____________________________ Enid Baasadhika Cory Rama, MD rk:mw D: 11/01/2014 13:52:25 ET T: 11/01/2014 16:15:22 ET JOB#: [id number]  cc: Enid Baasadhika Carvin Almas, MD, <Dictator> Enid BaasADHIKA Kita Neace MD ELECTRONICALLY SIGNED 11/03/2014 10:58

## 2015-05-01 IMAGING — CT CT LUMBAR SPINE WITH CONTRAST
4 of 11 series · 10 of 33 positions shown, 11 images · non-contrast
Comparison: none

REASON FOR EXAM: leukocytosis, MSRA osteomyelitis- eval if abscesses
COMMENTS:

PROCEDURE:     CT  - CT LUMBAR SPINE W  - February 18, 2013 [DATE]
RESULT:     History: Leukocytosis. Ostia myelitis.
Present study: CT lumbar spine of 02/13/2013.

[Series 609: axial osteo · axial · 0.38mm/px · z∈[+330,+425]mm · 2 of 115 slices shown, 3 images]
[im 39/115  soft-tissue]
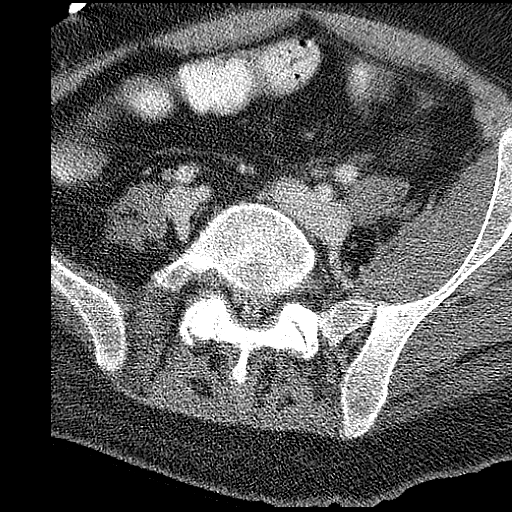
[im 39/115  bone]
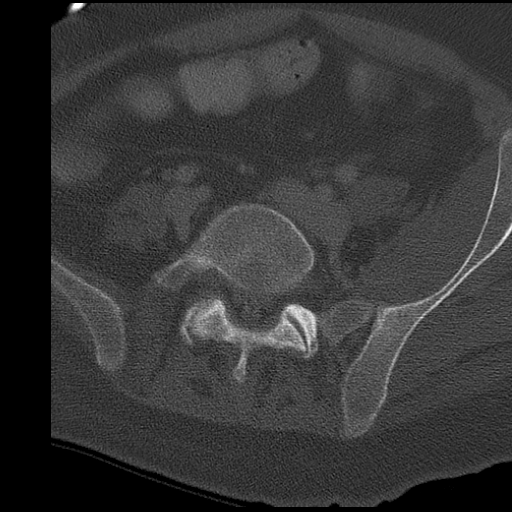
[im 77/115  bone]
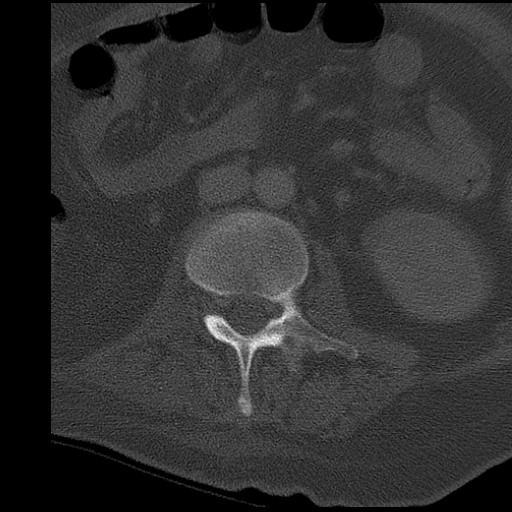

[Series 610: osteo coronal · coronal · 0.38mm/px · 1 of 62 slices shown]
[im 31/62  bone]
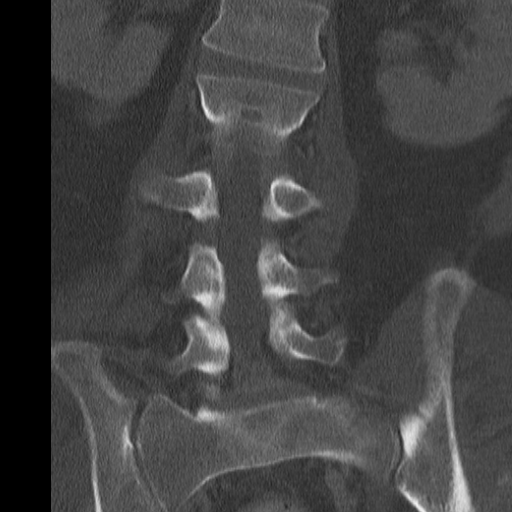

[Series 611: sagittal · sagittal · 0.38mm/px · 5 of 75 slices shown]
[im 11/75  bone]
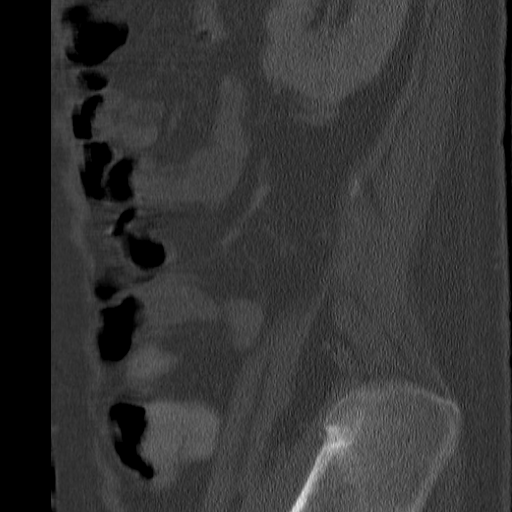
[im 22/75  bone]
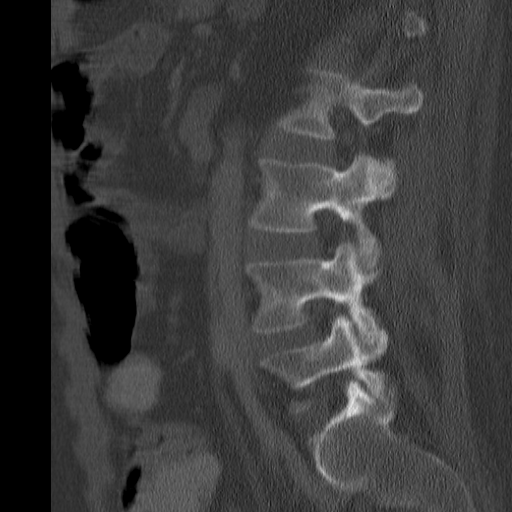
[im 32/75  bone]
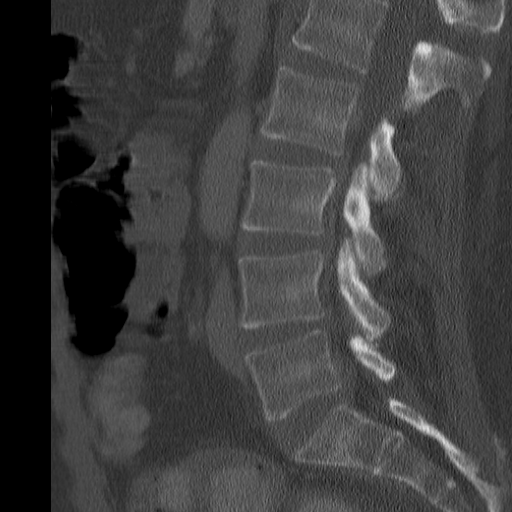
[im 43/75  bone]
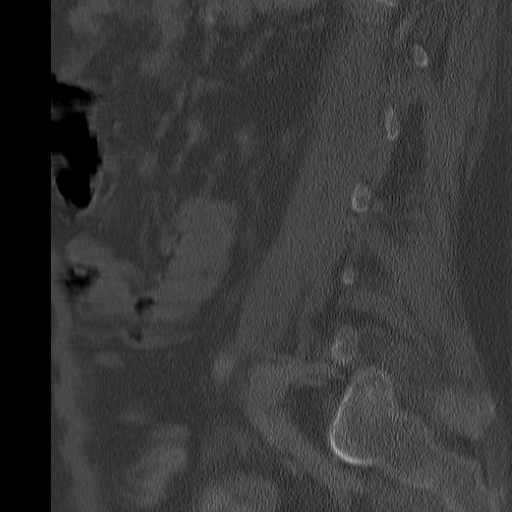
[im 53/75  bone]
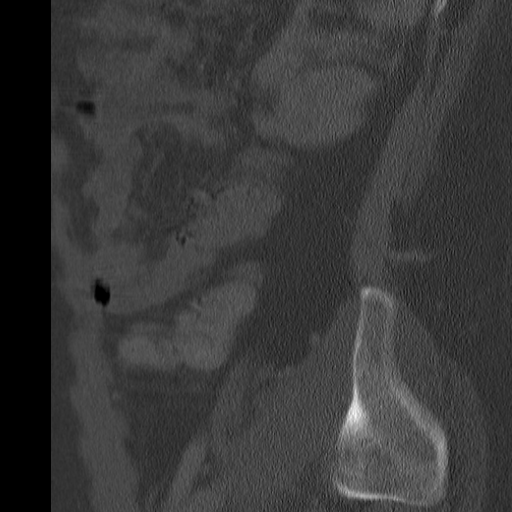

[Series 612: axial st · axial · 0.40mm/px · z∈[+337,+427]mm · 2 of 109 slices shown]
[im 37/109  bone]
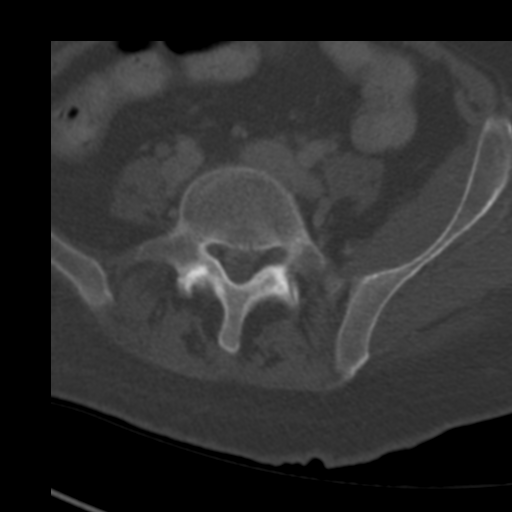
[im 73/109  bone]
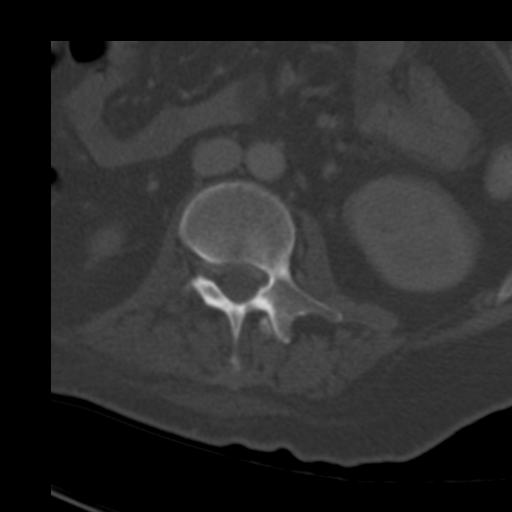

[10 of 33 positions shown; findings below may reference images not displayed]

FINDINGS: Standard CT lumbar spine is obtained. Lumbar spine bony structures
are intact. Thecal sac is patent throughout. Disc spaces are preserved.
There are diffuse degenerative changes. Again noted are destructive changes
of the left iliac wing with adjacent fluid collection in the gluteal
muscles. These findings suggest the possibility osteomyelitis with adjacent
soft tissue abscess. Edema  left iliopsoas muscle. Similar findings noted on
prior exam.
IMPRESSION: Persistent destructive changes of the left iliac wing with
fluid collection in the left gluteal musculature. These findings suggest
osteomyelitis with gluteal abscess. Similar findings noted on prior study of
02/13/2013.
